# Patient Record
Sex: Male | Born: 1940 | Race: Asian | Hispanic: No | Marital: Married | Smoking: Never smoker
Health system: Southern US, Community
[De-identification: ages and names within clinical notes are randomized; demographics above are authoritative.]

## PROBLEM LIST (undated history)

## (undated) DIAGNOSIS — D649 Anemia, unspecified: Secondary | ICD-10-CM

## (undated) DIAGNOSIS — K219 Gastro-esophageal reflux disease without esophagitis: Secondary | ICD-10-CM

## (undated) DIAGNOSIS — D75839 Thrombocytosis, unspecified: Secondary | ICD-10-CM

## (undated) DIAGNOSIS — D473 Essential (hemorrhagic) thrombocythemia: Secondary | ICD-10-CM

## (undated) DIAGNOSIS — E119 Type 2 diabetes mellitus without complications: Secondary | ICD-10-CM

## (undated) DIAGNOSIS — D72829 Elevated white blood cell count, unspecified: Secondary | ICD-10-CM

## (undated) HISTORY — DX: Thrombocytosis, unspecified: D75.839

## (undated) HISTORY — DX: Type 2 diabetes mellitus without complications: E11.9

## (undated) HISTORY — PX: HERNIA REPAIR: SHX51

## (undated) HISTORY — DX: Gastro-esophageal reflux disease without esophagitis: K21.9

## (undated) HISTORY — DX: Elevated white blood cell count, unspecified: D72.829

## (undated) HISTORY — DX: Essential (hemorrhagic) thrombocythemia: D47.3

## (undated) HISTORY — DX: Anemia, unspecified: D64.9

---

## 2003-07-16 ENCOUNTER — Emergency Department (HOSPITAL_COMMUNITY): Admission: AC | Admit: 2003-07-16 | Discharge: 2003-07-16 | Payer: Self-pay

## 2003-07-17 ENCOUNTER — Emergency Department (HOSPITAL_COMMUNITY): Admission: EM | Admit: 2003-07-17 | Discharge: 2003-07-17 | Payer: Self-pay | Admitting: Emergency Medicine

## 2006-02-04 ENCOUNTER — Ambulatory Visit: Payer: Self-pay | Admitting: Oncology

## 2009-09-09 ENCOUNTER — Emergency Department (HOSPITAL_BASED_OUTPATIENT_CLINIC_OR_DEPARTMENT_OTHER): Admission: EM | Admit: 2009-09-09 | Discharge: 2009-09-09 | Payer: Self-pay | Admitting: Emergency Medicine

## 2009-09-09 ENCOUNTER — Ambulatory Visit: Payer: Self-pay | Admitting: Diagnostic Radiology

## 2011-02-18 LAB — DIFFERENTIAL
Basophils Absolute: 0 10*3/uL (ref 0.0–0.1)
Eosinophils Relative: 0 % (ref 0–5)
Lymphocytes Relative: 2 % — ABNORMAL LOW (ref 12–46)
Lymphs Abs: 0.6 10*3/uL — ABNORMAL LOW (ref 0.7–4.0)
Monocytes Absolute: 0.3 10*3/uL (ref 0.1–1.0)
Monocytes Relative: 1 % — ABNORMAL LOW (ref 3–12)
Neutro Abs: 27.3 10*3/uL — ABNORMAL HIGH (ref 1.7–7.7)

## 2011-02-18 LAB — BASIC METABOLIC PANEL
CO2: 27 mEq/L (ref 19–32)
Calcium: 9.4 mg/dL (ref 8.4–10.5)
Chloride: 103 mEq/L (ref 96–112)
GFR calc non Af Amer: >60 mL/min (ref >60)
Glucose, Bld: 176 mg/dL — ABNORMAL HIGH (ref 70–99)
Sodium: 141 mEq/L (ref 135–145)

## 2011-02-18 LAB — URINE MICROSCOPIC-ADD ON

## 2011-02-18 LAB — CBC
HCT: 44.8 % (ref 39.0–52.0)
MCHC: 33.3 g/dL (ref 30.0–36.0)
MCV: 88.3 fL (ref 78.0–100.0)
Platelets: 664 10*3/uL — ABNORMAL HIGH (ref 150–400)
RBC: 5.07 MIL/uL (ref 4.22–5.81)
RDW: 12.1 % (ref 11.5–15.5)
WBC: 28.2 10*3/uL — ABNORMAL HIGH (ref 4.0–10.5)

## 2011-02-18 LAB — URINE CULTURE: Culture: NO GROWTH

## 2011-02-18 LAB — URINALYSIS, ROUTINE W REFLEX MICROSCOPIC
Ketones, ur: NEGATIVE mg/dL
Nitrite: NEGATIVE
Urobilinogen, UA: 0.2 mg/dL (ref 0.0–1.0)
pH: 5 (ref 5.0–8.0)

## 2011-03-04 ENCOUNTER — Encounter (HOSPITAL_BASED_OUTPATIENT_CLINIC_OR_DEPARTMENT_OTHER): Payer: Medicare Other | Admitting: Oncology

## 2011-03-04 ENCOUNTER — Other Ambulatory Visit: Payer: Self-pay | Admitting: Oncology

## 2011-03-04 DIAGNOSIS — E119 Type 2 diabetes mellitus without complications: Secondary | ICD-10-CM

## 2011-03-04 DIAGNOSIS — D72829 Elevated white blood cell count, unspecified: Secondary | ICD-10-CM

## 2011-03-04 DIAGNOSIS — D72810 Lymphocytopenia: Secondary | ICD-10-CM

## 2011-03-04 DIAGNOSIS — D473 Essential (hemorrhagic) thrombocythemia: Secondary | ICD-10-CM

## 2011-03-04 LAB — CHCC SMEAR

## 2011-03-04 LAB — COMPREHENSIVE METABOLIC PANEL
ALT: 12 U/L (ref 0–53)
Albumin: 5.4 g/dL — ABNORMAL HIGH (ref 3.5–5.2)
BUN: 14 mg/dL (ref 6–23)
Calcium: 10.2 mg/dL (ref 8.4–10.5)
Potassium: 4.7 mEq/L (ref 3.5–5.3)
Sodium: 142 mEq/L (ref 135–145)

## 2011-03-04 LAB — CBC WITH DIFFERENTIAL/PLATELET
BASO%: 0.8 % (ref 0.0–2.0)
Basophils Absolute: 0.2 10*3/uL — ABNORMAL HIGH (ref 0.0–0.1)
MCH: 29 pg (ref 27.2–33.4)
MCV: 88.4 fL (ref 79.3–98.0)
MONO#: 1 10*3/uL — ABNORMAL HIGH (ref 0.1–0.9)
MONO%: 5.3 % (ref 0.0–14.0)
NEUT#: 17 10*3/uL — ABNORMAL HIGH (ref 1.5–6.5)
NEUT%: 87.5 % — ABNORMAL HIGH (ref 39.0–75.0)
nRBC: 0 % (ref 0–0)

## 2011-03-04 LAB — TECHNOLOGIST REVIEW

## 2011-03-09 LAB — BCR/ABL (LIO MMD)

## 2011-03-09 LAB — JAK-2 V617F

## 2011-06-16 ENCOUNTER — Other Ambulatory Visit: Payer: Self-pay | Admitting: Oncology

## 2011-06-16 ENCOUNTER — Encounter (HOSPITAL_BASED_OUTPATIENT_CLINIC_OR_DEPARTMENT_OTHER): Payer: Medicare Other | Admitting: Oncology

## 2011-06-16 DIAGNOSIS — D72810 Lymphocytopenia: Secondary | ICD-10-CM

## 2011-06-16 DIAGNOSIS — D649 Anemia, unspecified: Secondary | ICD-10-CM

## 2011-06-16 DIAGNOSIS — D473 Essential (hemorrhagic) thrombocythemia: Secondary | ICD-10-CM

## 2011-06-16 DIAGNOSIS — D72829 Elevated white blood cell count, unspecified: Secondary | ICD-10-CM

## 2011-06-16 LAB — CBC WITH DIFFERENTIAL/PLATELET
Eosinophils Absolute: 0.2 10*3/uL (ref 0.0–0.5)
HGB: 12.6 g/dL — ABNORMAL LOW (ref 13.0–17.1)
LYMPH%: 6.2 % — ABNORMAL LOW (ref 14.0–49.0)
MCH: 28.8 pg (ref 27.2–33.4)
MCHC: 32.9 g/dL (ref 32.0–36.0)
MCV: 87.6 fL (ref 79.3–98.0)
NEUT#: 15.3 10*3/uL — ABNORMAL HIGH (ref 1.5–6.5)
NEUT%: 89.1 % — ABNORMAL HIGH (ref 39.0–75.0)
RBC: 4.37 10*6/uL (ref 4.20–5.82)
RDW: 13.6 % (ref 11.0–14.6)
WBC: 17.2 10*3/uL — ABNORMAL HIGH (ref 4.0–10.3)
lymph#: 1.1 10*3/uL (ref 0.9–3.3)

## 2011-08-07 ENCOUNTER — Emergency Department (HOSPITAL_COMMUNITY): Payer: Medicare Other

## 2011-08-07 ENCOUNTER — Inpatient Hospital Stay (HOSPITAL_COMMUNITY)
Admission: EM | Admit: 2011-08-07 | Discharge: 2011-08-10 | DRG: 816 | Disposition: A | Discharge status: Home / Self Care | Payer: Medicare Other | Attending: Family Medicine | Admitting: Family Medicine

## 2011-08-07 ENCOUNTER — Inpatient Hospital Stay (HOSPITAL_COMMUNITY): Payer: Medicare Other

## 2011-08-07 DIAGNOSIS — D689 Coagulation defect, unspecified: Secondary | ICD-10-CM

## 2011-08-07 DIAGNOSIS — D473 Essential (hemorrhagic) thrombocythemia: Principal | ICD-10-CM | POA: Diagnosis present

## 2011-08-07 DIAGNOSIS — R52 Pain, unspecified: Secondary | ICD-10-CM | POA: Diagnosis present

## 2011-08-07 DIAGNOSIS — D72829 Elevated white blood cell count, unspecified: Secondary | ICD-10-CM | POA: Diagnosis present

## 2011-08-07 DIAGNOSIS — K644 Residual hemorrhoidal skin tags: Secondary | ICD-10-CM | POA: Diagnosis present

## 2011-08-07 DIAGNOSIS — N289 Disorder of kidney and ureter, unspecified: Secondary | ICD-10-CM | POA: Diagnosis not present

## 2011-08-07 DIAGNOSIS — S20219A Contusion of unspecified front wall of thorax, initial encounter: Secondary | ICD-10-CM | POA: Diagnosis present

## 2011-08-07 DIAGNOSIS — X58XXXA Exposure to other specified factors, initial encounter: Secondary | ICD-10-CM | POA: Diagnosis present

## 2011-08-07 DIAGNOSIS — Z7982 Long term (current) use of aspirin: Secondary | ICD-10-CM

## 2011-08-07 DIAGNOSIS — D699 Hemorrhagic condition, unspecified: Secondary | ICD-10-CM

## 2011-08-07 DIAGNOSIS — D649 Anemia, unspecified: Secondary | ICD-10-CM | POA: Diagnosis present

## 2011-08-07 DIAGNOSIS — K5900 Constipation, unspecified: Secondary | ICD-10-CM | POA: Diagnosis not present

## 2011-08-07 DIAGNOSIS — E119 Type 2 diabetes mellitus without complications: Secondary | ICD-10-CM | POA: Diagnosis present

## 2011-08-07 LAB — CBC
HCT: 21.6 % — ABNORMAL LOW (ref 39.0–52.0)
Hemoglobin: 7.4 g/dL — ABNORMAL LOW (ref 13.0–17.0)
MCH: 28.4 pg (ref 26.0–34.0)
MCH: 28.4 pg (ref 26.0–34.0)
MCV: 82.3 fL (ref 78.0–100.0)
RBC: 2.61 MIL/uL — ABNORMAL LOW (ref 4.22–5.81)

## 2011-08-07 LAB — TSH: TSH: 2.03 u[IU]/mL (ref 0.350–4.500)

## 2011-08-07 LAB — HEPATIC FUNCTION PANEL
AST: 21 U/L (ref 0–37)
Albumin: 4.1 g/dL (ref 3.5–5.2)
Total Protein: 6.8 g/dL (ref 6.0–8.3)

## 2011-08-07 LAB — SEDIMENTATION RATE: Sed Rate: 13 mm/hr (ref 0–16)

## 2011-08-07 LAB — URINALYSIS, ROUTINE W REFLEX MICROSCOPIC
Glucose, UA: 500 mg/dL — AB
Hgb urine dipstick: NEGATIVE
Ketones, ur: 15 mg/dL — AB
Leukocytes, UA: NEGATIVE
Protein, ur: NEGATIVE mg/dL
Specific Gravity, Urine: 1.019 (ref 1.005–1.030)
Urobilinogen, UA: 0.2 mg/dL (ref 0.0–1.0)

## 2011-08-07 LAB — GLUCOSE, CAPILLARY
Glucose-Capillary: 202 mg/dL — ABNORMAL HIGH (ref 70–99)
Glucose-Capillary: 177 mg/dL — ABNORMAL HIGH (ref 70–99)
Glucose-Capillary: 179 mg/dL — ABNORMAL HIGH (ref 70–99)
Glucose-Capillary: 182 mg/dL — ABNORMAL HIGH (ref 70–99)

## 2011-08-07 LAB — OCCULT BLOOD, POC DEVICE: Fecal Occult Bld: NEGATIVE

## 2011-08-07 LAB — BASIC METABOLIC PANEL
BUN: 37 mg/dL — ABNORMAL HIGH (ref 6–23)
Creatinine, Ser: 1.5 mg/dL — ABNORMAL HIGH (ref 0.50–1.35)
GFR calc Af Amer: 56 mL/min — ABNORMAL LOW (ref >60)
GFR calc non Af Amer: 46 mL/min — ABNORMAL LOW (ref >60)
Potassium: 4.6 mEq/L (ref 3.5–5.1)

## 2011-08-07 LAB — FERRITIN: Ferritin: 255 ng/mL (ref 22–322)

## 2011-08-07 LAB — LIPASE, BLOOD: Lipase: 10 U/L — ABNORMAL LOW (ref 11–59)

## 2011-08-07 LAB — DIFFERENTIAL
Eosinophils Absolute: 0 10*3/uL (ref 0.0–0.7)
Lymphs Abs: 1.5 10*3/uL (ref 0.7–4.0)

## 2011-08-08 LAB — BASIC METABOLIC PANEL
BUN: 21 mg/dL (ref 6–23)
GFR calc Af Amer: >60 mL/min (ref >60)
GFR calc non Af Amer: >60 mL/min (ref >60)
Potassium: 4 mEq/L (ref 3.5–5.1)
Sodium: 135 mEq/L (ref 135–145)

## 2011-08-08 LAB — CBC
HCT: 19.2 % — ABNORMAL LOW (ref 39.0–52.0)
Hemoglobin: 6.4 g/dL — CL (ref 13.0–17.0)
Hemoglobin: 9.6 g/dL — ABNORMAL LOW (ref 13.0–17.0)
MCH: 28.1 pg (ref 26.0–34.0)
MCH: 28.2 pg (ref 26.0–34.0)
MCV: 84.2 fL (ref 78.0–100.0)
RBC: 2.28 MIL/uL — ABNORMAL LOW (ref 4.22–5.81)
RBC: 3.4 MIL/uL — ABNORMAL LOW (ref 4.22–5.81)

## 2011-08-08 LAB — PROTIME-INR
INR: 1.26 (ref 0.00–1.49)
Prothrombin Time: 16.1 seconds — ABNORMAL HIGH (ref 11.6–15.2)

## 2011-08-08 LAB — DIFFERENTIAL
Basophils Relative: 0 % (ref 0–1)
Eosinophils Absolute: 0.3 10*3/uL (ref 0.0–0.7)
Eosinophils Relative: 1 % (ref 0–5)
Lymphs Abs: 0.8 10*3/uL (ref 0.7–4.0)
Monocytes Absolute: 2.2 10*3/uL — ABNORMAL HIGH (ref 0.1–1.0)

## 2011-08-08 LAB — URINE CULTURE
Colony Count: NO GROWTH
Culture: NO GROWTH

## 2011-08-08 LAB — GLUCOSE, CAPILLARY
Glucose-Capillary: 160 mg/dL — ABNORMAL HIGH (ref 70–99)
Glucose-Capillary: 180 mg/dL — ABNORMAL HIGH (ref 70–99)

## 2011-08-09 LAB — CROSSMATCH
ABO/RH(D): B POS
Antibody Screen: NEGATIVE
Unit division: 0

## 2011-08-09 LAB — GLUCOSE, CAPILLARY
Glucose-Capillary: 199 mg/dL — ABNORMAL HIGH (ref 70–99)
Glucose-Capillary: 186 mg/dL — ABNORMAL HIGH (ref 70–99)
Glucose-Capillary: 178 mg/dL — ABNORMAL HIGH (ref 70–99)

## 2011-08-09 LAB — PATHOLOGIST SMEAR REVIEW

## 2011-08-09 LAB — CBC
HCT: 26 % — ABNORMAL LOW (ref 39.0–52.0)
Platelets: 816 10*3/uL — ABNORMAL HIGH (ref 150–400)
RDW: 14.4 % (ref 11.5–15.5)
WBC: 30.4 10*3/uL — ABNORMAL HIGH (ref 4.0–10.5)

## 2011-08-09 LAB — BASIC METABOLIC PANEL
Chloride: 100 mEq/L (ref 96–112)
GFR calc Af Amer: >60 mL/min (ref >60)
Potassium: 3.8 mEq/L (ref 3.5–5.1)
Sodium: 136 mEq/L (ref 135–145)

## 2011-08-10 LAB — CBC
HCT: 28.4 % — ABNORMAL LOW (ref 39.0–52.0)
MCV: 84.8 fL (ref 78.0–100.0)
Platelets: 713 10*3/uL — ABNORMAL HIGH (ref 150–400)
RBC: 3.35 MIL/uL — ABNORMAL LOW (ref 4.22–5.81)
WBC: 28.3 10*3/uL — ABNORMAL HIGH (ref 4.0–10.5)

## 2011-08-10 LAB — BASIC METABOLIC PANEL
BUN: 17 mg/dL (ref 6–23)
CO2: 30 mEq/L (ref 19–32)
Chloride: 101 mEq/L (ref 96–112)
Creatinine, Ser: 0.67 mg/dL (ref 0.50–1.35)
Potassium: 4 mEq/L (ref 3.5–5.1)

## 2011-08-10 LAB — GLUCOSE, CAPILLARY
Glucose-Capillary: 173 mg/dL — ABNORMAL HIGH (ref 70–99)
Glucose-Capillary: 181 mg/dL — ABNORMAL HIGH (ref 70–99)

## 2011-08-12 ENCOUNTER — Other Ambulatory Visit: Payer: Self-pay | Admitting: Family Medicine

## 2011-08-12 DIAGNOSIS — R1902 Left upper quadrant abdominal swelling, mass and lump: Secondary | ICD-10-CM

## 2011-08-16 ENCOUNTER — Other Ambulatory Visit: Payer: Self-pay | Admitting: Family Medicine

## 2011-08-16 ENCOUNTER — Other Ambulatory Visit: Payer: Self-pay

## 2011-08-16 ENCOUNTER — Ambulatory Visit
Admission: RE | Admit: 2011-08-16 | Discharge: 2011-08-16 | Disposition: A | Discharge status: Home / Self Care | Payer: Medicare Other | Source: Ambulatory Visit | Attending: Family Medicine | Admitting: Family Medicine

## 2011-08-16 DIAGNOSIS — R1902 Left upper quadrant abdominal swelling, mass and lump: Secondary | ICD-10-CM

## 2011-08-16 MED ORDER — IOHEXOL 300 MG/ML  SOLN: 100.0000 mL | Freq: Once | INTRAMUSCULAR | Status: AC | PRN

## 2011-09-01 NOTE — H&P (Signed)
NAMEMALIC, ROSTEN NO.:  [id number]  MEDICAL RECORD NO.:  [id number]  LOCATION:  4506                         FACILITY:  [facility]  PHYSICIAN:  Gaila Engebretsen A. Sheffield Slider, M.D.    DATE OF BIRTH:  10-14-1941  DATE OF ADMISSION:  08/07/2011 DATE OF DISCHARGE:                             HISTORY & PHYSICAL   PRIMARY CARE PHYSICIAN:  Titus Dubin. Alwyn Ren, MD,FACP,FCCP at [place]  HEMATOLOGIST:  Benjiman Core, MD  CHIEF COMPLAINT:  Generalized pain.  HISTORY OF PRESENT ILLNESS:  Mr. Isidore is a 70 year old [place] man who does not speak any English, therefore the HPI is provided by his son. The patient had acute onset generalized pain starting 4 days ago.  It is primarily in his right shoulder and legs bilaterally.  He denies any recent falls or trauma.  It is preventing him from using his right upper extremity completely.  This is a large deviation from baseline, which is very active.  During his workup at [facility] ED, the patient was found to have a white blood cell count 49.6 and a platelet count of 1,400,000. According to his son, Mr. Corsino has been told before that he has elevated blood cell counts and has seen a physician at [facility] regarding the problem.  However, the patient states that he was not supposed to followup for another year.  MEDICAL HISTORY: 1. Unspecified leukocytosis and thrombocytosis. 2. Diabetes mellitus type 2. 3. Fractured clavicle, left.  ALLERGIES:  No known drug allergies.  MEDICATIONS: 1. Metformin 500 mg b.i.d. 2. Aspirin 81 mg daily.  PAST SURGICAL HISTORY:  Hernia repair, left side.  SOCIAL HISTORY:  The patient lives with wife.  He is retired.  Denies tobacco.  He occasionally uses alcohol.  He does not use any recreational drugs.  REVIEW OF SYSTEMS:  Positive for increasing appetite, increasing fatigue, weight loss of 50 pounds in 2 years, constipation x4 days, urinary hesitancy, bruising on his anterior lower  extremities bilaterally, generalized weakness, and polyuria.  PHYSICAL EXAMINATION:  VITAL SIGNS:  Temperature at the time of admission 97.5, pulse 86, respiratory rate 20, blood pressure 98/46, oxygen saturation 100% on room air. GENERAL:  The patient is alert and oriented x3, is quiet, little comprehensive in [place]. HEENT: Normocephalic, atraumatic.  Pupils equal, round, and reactive to light.  Poor dentition.  Oropharynx clear. NECK:  Supple, nontender, no adenopathy. CARDIOVASCULAR:  2/6 systolic murmur, sinus arrhythmia. LUNGS:  Decreased breath sounds on right versus left, dullness to percussion right versus left. ABDOMEN:  Soft, nondistended, tender to palpation in the epigastric area.  No masses noted.  Hyperactive bowel sounds x4 quadrants. BACK:  Asymmetry of upper back right side with prominent soft tissue swelling over scapula and this was dorsi, ecchymosis noted on the right flank. GENITOURINARY:  Right-sided indirect hernias nontender, nonreducible. RECTAL:  One small non-thrombosed external hemorrhoid.  Good rectal tone.  Stool in rectal vault. EXTREMITIES:  Significant bruising and hemosiderin deposition on anterior tibia bilaterally, 2+ posterior tibial pulses and radial pulses bilaterally, nontender extremities, no joint effusions. NEUROLOGIC:  1/4 patellar and plantar reflexes most likely secondary to poor patient compliance with exam. MUSCULOSKELETAL:  Normal muscle  tone, grip strength equal bilaterally 5/5 left upper extremity.  Lower extremity strength bilaterally.  LABS AND STUDIES:  BMET; sodium 130, potassium 4.6, chloride 92, CO2 of 26, BUN 37, creatinine 1.5, glucose 289, calcium 9.5.  Urinalysis positive for glucose and ketones.  CBC; white blood cell count 49.6, hemoglobin 8.8, hematocrit 25.5, platelets 1449, MCV 87.3, RDW equals 13.9, percent neutrophils equals 89.  Chest and abdominal x-ray no acute cardiopulmonary events.  Round calcification of  right upper quadrant. Unremarkable gas pattern.  ASSESSMENT/PLAN:  Mr. Ron is a 70 year old male with subacute onset pain predominantly right shoulder as well as significant thrombocytosis and leukocytosis of undetermined origin, requires further workup. 1. Admit the patient to [facility] Service under     attending physician, Dr. Sheffield Slider to a regular floor bed. 2. Hematology/Oncology.  The thrombocytosis and leukocytosis could be     reactive, but there is no known source of infection.  It could     represent a malignancy or blood cell dyscrasia.     a.     Check ESR, CRP, ferritin, and CT abdomen to evaluate spleen.     b.     Check on records from hematologist, Dr. Clelia Croft. 3. Musculoskeletal pain in the right shoulder.  Posterior back seem to     have suffered trauma based on swelling and bruising; however, there     could be an element of scoliosis causing asymmetry, but would not     account for the edema and exquisite tenderness.     a.     Medications; morphine 2 mg IV q.4 h. p.r.n. for pain,      Tylenol 650 mg q.4 h. p.r.n. for pain.     b.     Studies PA and lateral chest scapula x-ray on the right. 4. Constipation.  MiraLax 17 g daily, glycerin suppository 2 g per     rectum daily. 5. Fluid, electrolytes, and nutrition/gastrointestinal.  IV fluids and     normal saline 100 mL/hour, carb-modified diet as tolerated, 6. Diabetes mellitus.  Hold metformin for now secondary to increased     creatinine, give sliding scale insulin before     every meals and nightly. 7. Prophylaxis.  Heparin 5000 units t.i.d. subcu, Protonix 40 mg p.o.     daily. 8. Disposition.  Pending further workup.    ______________________________ Mat Carne, MD   ______________________________ Arnette Norris. Sheffield Slider, M.D.    EW/MEDQ  D:  08/10/2011  T:  08/10/2011  Job:  [id number]  Electronically Signed by Mat Carne  on 08/23/2011 04:02:03 PM Electronically Signed by Zachery Dauer  M.D. on 09/01/2011 10:06:19 PM

## 2011-09-01 NOTE — Discharge Summary (Signed)
[name], [name] NO.:  [id number]  MEDICAL RECORD NO.:  [id number]  LOCATION:  4506                         FACILITY:  [facility]  PHYSICIAN:  [name], M.D.    DATE OF BIRTH:  [birth year]  DATE OF ADMISSION:  08/07/2011 DATE OF DISCHARGE:  08/10/2011                              DISCHARGE SUMMARY   PRIMARY CARE PHYSICIAN:  [name], [name],[name], [name] at [place].  HEMATOLOGIST:  [name]. Clelia Croft, [name]  ADMISSION DIAGNOSIS:  Marked thrombocytosis and leukocytosis with concern for malignancy.  DISCHARGE DIAGNOSES: 1. Hematoma, right lateral posterior chest wall. 2. Anemia which is resolving. 3. Reactive leukocytosis and thrombocytosis, resolving. 4. Diabetes mellitus type 2.  HOSPITAL COURSE:  [name] presented to the emergency department with a 4-day history of significant generalized pain, most noteworthy in the right upper extremity, right posterior shoulder, as well as constipation.  In the emergency department, he was found to have a CBC noteworthy for white blood cell count of 49.6, hemoglobin of 8.8, and platelets of 1449.  The physician was concerned that this could represent a malignant process.  So, the patient was admitted to the [facility] Medicine Teaching Service for further workup.  PROBLEMS: 1. Hematology.  Upon admission, the patient had a CT of the chest wall     performed that showed a 13.1 cm x 4.7 cm x 19.1 cm hematoma of the     right posterior lateral chest wall of undetermined origin.  The     patient was in significant amount of pain with decreased range of     motion of the right upper extremity secondary to the hematoma,     therefore morphine was used for pain control.  His CBC on day 2 of     admission was white blood cell count 36.4, hemoglobin 7.4,     platelets 1143.  On the next day, the patient's hemoglobin had     declined to 6.4, therefore the patient was given 2 units of packed     red blood cells.  After the use  of packed red blood cells,     hemoglobin increased to 9.6, and at that time the white blood cell     count was 34 and platelets were 935.  Given the concern for     malignant process, pathologist reviewed blood smear and noted that     marked thrombocytosis associated with neutrophilia and mild left     shift appeared to be reactive and probably related.  The patient's     serum markers for inflammation include CRP that was elevated at     7.61, however, the patient's ESR was normal.  The patient also had     an elevated PT at 16.1, INR 1.26, and PTT of 42 for undetermined     causes.  By day 3 of admission, the patient's hematoma had began to     decrease in size and create a large ecchymosis along the right     flank.  The patient also had decreasing pain and increasing range     of motion.  According to the patient's son, he had been worked  up     in the past by [name] for this thrombocytosis and leukocytosis.     Therefore, we attempted to contact [name] office several     times on Monday, August 09, 2011, and Tuesday August 10, 2011.  However, we received no reply, [name] regarding the     patient's history.  When we requested to schedule followup for the     patient, we were told that [name] likes to review the patient's     records first and then set up the followup.  By the fourth day of     admission, the patient was back to baseline regarding range of     motion and pain in the right upper extremity, however, his hematoma     was still noteworthy lateral to his right scapula that was     resolving and his hemoglobin had stabilized.  On the day of     discharge, a CBC was noteworthy for a white blood cell count of     28.3, hemoglobin of 9.3, and platelets of 713. 2. Diabetes mellitus type 2.  The patient had a hemoglobin A1c of 7.6,     was treated with sliding scale insulin while in the hospital.  DISCHARGE CONDITION:  The patient has a right-sided  flank ecchymosis extending from the axilla to the upper aspect of the right buttocks and hematoma of the posterior lateral chest wall on the right side that is resolving.  His hemoglobin is stable and his BMET was within normal limits except no elevated glucose.  DISCHARGE MEDICATIONS: 1. Polyethylene glycol 17 g by mouth daily. 2. Aspirin 81 mg by mouth daily. 3. Centrum Silver 1 tablet by mouth daily. 4. Metformin 500 mg by mouth twice daily.  FOLLOWUP: 1. Hematology.  The patient will followup with [name] whenever     appropriate. 2. Patient will follow up with Dr. Marga Melnick, his primary care     physician within 7 days.  FOLLOWUP ISSUES: 1. His recheck CBC to make sure that hemoglobin is stable or     increasing from his discharge hemoglobin of 9.3. 2. Blood glucose control. 3. Pain management for hematoma.    ______________________________ [name]   ______________________________ [name], M.D.    EW/MEDQ  D:  08/10/2011  T:  08/10/2011  Job:  [id number]  cc:   [name], [name],[name],[name] Benjiman Core, M.D.  Electronically Signed by [name]  on 08/23/2011 04:01:45 PM Electronically Signed by [name] M.D. on 09/01/2011 10:06:11 PM

## 2011-09-15 ENCOUNTER — Ambulatory Visit (INDEPENDENT_AMBULATORY_CARE_PROVIDER_SITE_OTHER): Payer: Medicare Other | Admitting: Gastroenterology

## 2011-09-15 ENCOUNTER — Telehealth: Payer: Self-pay | Admitting: Gastroenterology

## 2011-09-15 ENCOUNTER — Encounter: Payer: Self-pay | Admitting: Gastroenterology

## 2011-09-15 VITALS — BP 118/60 | HR 60 | Ht 65.0 in | Wt 119.0 lb

## 2011-09-15 DIAGNOSIS — K5900 Constipation, unspecified: Secondary | ICD-10-CM

## 2011-09-15 DIAGNOSIS — D649 Anemia, unspecified: Secondary | ICD-10-CM

## 2011-09-15 DIAGNOSIS — R634 Abnormal weight loss: Secondary | ICD-10-CM

## 2011-09-15 MED ORDER — PEG-KCL-NACL-NASULF-NA ASC-C 100 G PO SOLR: 1.0000 | Freq: Once | ORAL | Status: DC

## 2011-09-15 NOTE — Telephone Encounter (Signed)
Spoke with Tammy and informed her that we do have a free sample of this prep for the patient if they can come by and get it for him. Tammy states she will come get it tomorrow. Sample kit left up front.

## 2011-09-15 NOTE — Patient Instructions (Signed)
You have been scheduled for a Colonoscopy with propofol. See separate instructions.  Pick up your prep kit from your pharmacy.  Follow instructions on Hemoccult cards and mail back to [place] when finished.  cc: Janace Hoard, MD       Eli Hose, MD

## 2011-09-15 NOTE — Progress Notes (Signed)
History of Present Illness: This is a 70 year old male originally from [place]. He has lived in [place] for over 30 years. He is here today with his son and a Nurse, learning disability. His son provides most of the translation however the translator was needed on several occasions. The patient does not speak any [place]. He apparently has had a slow and steady weight loss of approximately 80 pounds over the past 2-3 years. His diet has been modified substantially for management of diabetes. He was hospitalized in September and I reviewed the records from that hospitalization. He had a right body hematoma with leukocytosis, anemia and thrombocytosis. He also had a mild elevation of his INR. His hemoglobin was 7.4 on admission and improved to 9.3 at discharge. The cause of these abnormalities has not been determined. He has had followup with Dr. Juliette Alcide. He relates a 3 to four-month history of constipation. Denies abdominal pain, diarrhea, change in stool caliber, melena, hematochezia, nausea, vomiting, dysphagia, reflux symptoms, chest pain.  Past Medical History  Diagnosis Date  . Leukocytosis   . Thrombocytosis   . Diabetes mellitus, type 2   . Anemia     Hx of   . GERD (gastroesophageal reflux disease)    Past Surgical History  Procedure Date  . Hernia repair     reports that he has never smoked. He has never used smokeless tobacco. He reports that he drinks alcohol. He reports that he does not use illicit drugs. family history is negative for Colon cancer. No Known Allergies  Outpatient Encounter Prescriptions as of 09/15/2011  Medication Sig Dispense Refill  . aspirin 81 MG tablet Take 81 mg by mouth daily.        . metFORMIN (GLUCOPHAGE) 500 MG tablet Take 500 mg by mouth 2 (two) times daily with a meal.        . Multiple Vitamin (MULTIVITAMIN) tablet Take 1 tablet by mouth daily.        . vitamin k 100 MCG tablet Take 100 mcg by mouth daily.        . peg 3350 powder (MOVIPREP) 100 G SOLR Take 1  kit (100 g total) by mouth once.  1 kit  0   Review of Systems: Pertinent positive and negative review of systems were noted in the above HPI section. All other review of systems were otherwise negative.  Physical Exam: General: Well developed , well nourished, no acute distress Head: Normocephalic and atraumatic Eyes:  sclerae anicteric, EOMI Ears: Normal auditory acuity Mouth: No deformity or lesions Neck: Supple, no masses or thyromegaly Lungs: Clear throughout to auscultation Heart: Regular rate and rhythm; no murmurs, rubs or bruits Abdomen: Soft, non tender and non distended. No masses, hepatosplenomegaly or hernias noted. Normal Bowel sounds Rectal: Deferred to colonoscopy  Musculoskeletal: Symmetrical with no gross deformities  Skin: No lesions on visible extremities Pulses:  Normal pulses noted Extremities: No clubbing, cyanosis, edema or deformities noted Neurological: Alert oriented x 4, grossly nonfocal Cervical Nodes:  No significant cervical adenopathy Inguinal Nodes: No significant inguinal adenopathy Psychological:  Alert and cooperative. Normal mood and affect  Assessment and Recommendations:  1. Constipation and long-term weight loss. Recent anemia associated with leukocytosis and thrombocytosis. Etiology unclear. Obtain stool Hemoccults. Request records from his hematologist for review. Rule out colorectal neoplasms. Increase dietary fiber and water intake. Schedule colonoscopy. The risks, benefits, and alternatives to colonoscopy with possible biopsy and possible polypectomy were discussed with the patient and they consent to proceed.

## 2011-10-12 ENCOUNTER — Telehealth: Payer: Self-pay | Admitting: *Deleted

## 2011-10-12 NOTE — Telephone Encounter (Signed)
CALLED PATIENTS HOME WITH MESSAGE LEFT FOR THE PATIENT/FAMILY TO CALL us. INTENT OF CALL TO INQUIRE AS TO NEED FOR AN INTERPRETER. NO ANSWER. SON'S TELEPHONE NUMBER PROVIDED HAS BEEN DISCONNECTED.

## 2011-10-12 NOTE — Telephone Encounter (Signed)
SON, DANNY CALLED AND STATED HE WOULD BE THE INTERPRETER TOMORROW FOR HIS FATHER. NO NEED FOR A CONE INTERPRETER.

## 2011-10-13 ENCOUNTER — Encounter: Payer: Self-pay | Admitting: Gastroenterology

## 2011-10-13 ENCOUNTER — Ambulatory Visit (AMBULATORY_SURGERY_CENTER): Payer: Medicare Other | Admitting: Gastroenterology

## 2011-10-13 DIAGNOSIS — R634 Abnormal weight loss: Secondary | ICD-10-CM

## 2011-10-13 DIAGNOSIS — D649 Anemia, unspecified: Secondary | ICD-10-CM

## 2011-10-13 DIAGNOSIS — D126 Benign neoplasm of colon, unspecified: Secondary | ICD-10-CM

## 2011-10-13 DIAGNOSIS — K5900 Constipation, unspecified: Secondary | ICD-10-CM

## 2011-10-13 LAB — GLUCOSE, CAPILLARY: Glucose-Capillary: 100 mg/dL — ABNORMAL HIGH (ref 70–99)

## 2011-10-13 MED ORDER — SODIUM CHLORIDE 0.9 % IV SOLN: 500.0000 mL | INTRAVENOUS | Status: DC

## 2011-10-13 NOTE — Progress Notes (Signed)
Propofol administered by m smith crna. See scanned intra procedure report. ewm

## 2011-10-13 NOTE — Progress Notes (Signed)
Patient did not experience any of the following events: a burn prior to discharge; a fall within the facility; wrong site/side/patient/procedure/implant event; or a hospital transfer or hospital admission upon discharge from the facility. (G8907) Patient did not have preoperative order for IV antibiotic SSI prophylaxis. (G8918)  

## 2011-10-14 ENCOUNTER — Telehealth: Payer: Self-pay

## 2011-10-14 NOTE — Telephone Encounter (Signed)
I left a message on the pt's a.m. To call if any questions or concerns. maw

## 2011-10-18 ENCOUNTER — Encounter: Payer: Self-pay | Admitting: Gastroenterology

## 2011-10-20 ENCOUNTER — Encounter: Payer: Self-pay | Admitting: Gastroenterology

## 2011-11-11 ENCOUNTER — Ambulatory Visit (INDEPENDENT_AMBULATORY_CARE_PROVIDER_SITE_OTHER): Payer: Medicare Other

## 2011-11-11 DIAGNOSIS — R161 Splenomegaly, not elsewhere classified: Secondary | ICD-10-CM

## 2011-11-11 DIAGNOSIS — D7289 Other specified disorders of white blood cells: Secondary | ICD-10-CM

## 2011-11-11 DIAGNOSIS — E119 Type 2 diabetes mellitus without complications: Secondary | ICD-10-CM

## 2011-11-11 DIAGNOSIS — R634 Abnormal weight loss: Secondary | ICD-10-CM

## 2011-11-27 ENCOUNTER — Telehealth: Payer: Self-pay | Admitting: Oncology

## 2011-11-27 NOTE — Telephone Encounter (Signed)
S/w pt's son re appt for 2/20. lmonvm for lawanna requesting maderin translator for appt 2/20 @ 11:30 am.

## 2011-11-30 ENCOUNTER — Ambulatory Visit (INDEPENDENT_AMBULATORY_CARE_PROVIDER_SITE_OTHER): Payer: Medicare Other

## 2011-11-30 DIAGNOSIS — I739 Peripheral vascular disease, unspecified: Secondary | ICD-10-CM

## 2011-11-30 DIAGNOSIS — M79609 Pain in unspecified limb: Secondary | ICD-10-CM

## 2011-12-09 ENCOUNTER — Ambulatory Visit (HOSPITAL_COMMUNITY): Payer: Self-pay

## 2011-12-14 ENCOUNTER — Ambulatory Visit (HOSPITAL_COMMUNITY)
Admission: RE | Admit: 2011-12-14 | Discharge: 2011-12-14 | Disposition: A | Discharge status: Home / Self Care | Payer: Medicare Other | Source: Ambulatory Visit | Attending: Family Medicine | Admitting: Family Medicine

## 2011-12-14 DIAGNOSIS — M79609 Pain in unspecified limb: Secondary | ICD-10-CM | POA: Insufficient documentation

## 2011-12-14 DIAGNOSIS — M79673 Pain in unspecified foot: Secondary | ICD-10-CM

## 2011-12-14 NOTE — Progress Notes (Signed)
ABI completed.  Preliminary report is >1.0 bilaterally. 

## 2011-12-25 ENCOUNTER — Telehealth: Payer: Self-pay

## 2011-12-25 NOTE — Telephone Encounter (Signed)
Calling for xrays results

## 2011-12-26 NOTE — Telephone Encounter (Signed)
LMOM FOR PT TO CB.  THE ONLY RESULT THAT WE HAVE IS THE CIRCULATION STUDY.  The circulation study (ankle-brachial index (was normal. I will have my assistant call the patient and let him know. If I recall there is some language barrier and they may have a little difficulty understanding any further problems return for recheck. I do not plan to do anything differently at this time.

## 2011-12-26 NOTE — Telephone Encounter (Signed)
SPOKE WITH SON AND GAVE RESULTS

## 2012-01-05 ENCOUNTER — Telehealth: Payer: Self-pay | Admitting: Oncology

## 2012-01-05 ENCOUNTER — Other Ambulatory Visit: Payer: Medicare Other | Admitting: Lab

## 2012-01-05 ENCOUNTER — Ambulatory Visit (HOSPITAL_BASED_OUTPATIENT_CLINIC_OR_DEPARTMENT_OTHER): Payer: Medicare Other | Admitting: Oncology

## 2012-01-05 VITALS — BP 127/58 | HR 65 | Temp 97.8°F | Ht 65.0 in | Wt 127.2 lb

## 2012-01-05 DIAGNOSIS — D649 Anemia, unspecified: Secondary | ICD-10-CM

## 2012-01-05 DIAGNOSIS — D473 Essential (hemorrhagic) thrombocythemia: Secondary | ICD-10-CM

## 2012-01-05 DIAGNOSIS — D72829 Elevated white blood cell count, unspecified: Secondary | ICD-10-CM

## 2012-01-05 LAB — CBC WITH DIFFERENTIAL/PLATELET
BASO%: 0 % (ref 0.0–2.0)
EOS%: 1.2 % (ref 0.0–7.0)
Eosinophils Absolute: 0.2 10*3/uL (ref 0.0–0.5)
MCH: 29.2 pg (ref 27.2–33.4)
MCHC: 32.9 g/dL (ref 32.0–36.0)
MCV: 88.7 fL (ref 79.3–98.0)
MONO%: 2.2 % (ref 0.0–14.0)
NEUT#: 15.9 10*3/uL — ABNORMAL HIGH (ref 1.5–6.5)
RBC: 4.44 10*6/uL (ref 4.20–5.82)
RDW: 14.7 % — ABNORMAL HIGH (ref 11.0–14.6)

## 2012-01-05 NOTE — Telephone Encounter (Signed)
gv pt appt schedule for aug and sent message to lawana for [employer].

## 2012-01-05 NOTE — Progress Notes (Signed)
Hematology and Oncology Follow Up Visit  [name] [id number] March 05, 1941 71 y.o. 01/05/2012 12:18 PM    CC: Nicholas Najjar, MD    Principle Diagnosis: A 71 year old gentleman with leukocytosis and thrombocytosis, first evaluated in April 2012, etiology including a myeloproliferative disorder versus reactive process.   Interim History:  [name] presents today for a followup visit.  This is a gentleman whom I saw for the first time back in April 2012.  He had presented with a white cell count of 19,000, hemoglobin 14, platelet count was 841.  His workup so far has been unrevealing for any specific myeloproliferative disorder.  His JAK2 mutations have been positive, indicating evidence against a myeloproliferative disorder. His BCR-Abl was negative.  He is asymptomatic at this point.  He is not reporting any chest pain, does not report any difficulty breathing, is not reporting any major changes in his performance status, is not reporting any change in his appetite or development of constitutional symptoms.  He had not reported any headaches or blurry vision, did not report any recent hospitalization or illnesses.  Medications: I have reviewed the patient's current medications. Current outpatient prescriptions:aspirin 81 MG tablet, Take 81 mg by mouth daily.  , Disp: , Rfl: ;  metFORMIN (GLUCOPHAGE) 500 MG tablet, Take 500 mg by mouth 2 (two) times daily with a meal.  , Disp: , Rfl: ;  Multiple Vitamin (MULTIVITAMIN) tablet, Take 1 tablet by mouth daily.  , Disp: , Rfl: ;  vitamin k 100 MCG tablet, Take 100 mcg by mouth daily.  , Disp: , Rfl:   Allergies: No Known Allergies  Past Medical History, Surgical history, Social history, and Family History were reviewed and updated.  Review of Systems: Constitutional:  Negative for fever, chills, night sweats, anorexia, weight loss, pain. Cardiovascular: no chest pain or dyspnea on exertion Respiratory: no cough, shortness of breath, or  wheezing Neurological: no TIA or stroke symptoms Dermatological: negative ENT: negative Skin: Negative. Gastrointestinal: no abdominal pain, change in bowel habits, or black or bloody stools Genito-Urinary: no dysuria, trouble voiding, or hematuria Hematological and Lymphatic: negative Breast: negative Musculoskeletal: negative Remaining ROS negative. Physical Exam: Blood pressure 127/58, pulse 65, temperature 97.8 F (36.6 C), temperature source Oral, height 5\' 5"  (1.651 m), weight 127 lb 3.2 oz (57.698 kg). ECOG: 1 General appearance: alert Head: Normocephalic, without obvious abnormality, atraumatic Neck: no adenopathy, no carotid bruit, no JVD, supple, symmetrical, trachea midline and thyroid not enlarged, symmetric, no tenderness/mass/nodules Lymph nodes: Cervical, supraclavicular, and axillary nodes normal. Heart:regular rate and rhythm, S1, S2 normal, no murmur, click, rub or gallop Lung:chest clear, no wheezing, rales, normal symmetric air entry Abdomin: soft, non-tender, without masses or organomegaly EXT:no erythema, induration, or nodules   Lab Results: Lab Results  Component Value Date   WBC 17.8* 01/05/2012   HGB 13.0 01/05/2012   HCT 39.4 01/05/2012   MCV 88.7 01/05/2012   PLT 801* 01/05/2012     Chemistry      Component Value Date/Time   NA 136 08/10/2011 0600   K 4.0 08/10/2011 0600   CL 101 08/10/2011 0600   CO2 30 08/10/2011 0600   BUN 17 08/10/2011 0600   CREATININE 0.67 08/10/2011 0600      Component Value Date/Time   CALCIUM 8.9 08/10/2011 0600   ALKPHOS 62 08/07/2011 0416   AST 21 08/07/2011 0416   ALT 10 08/07/2011 0416   BILITOT 0.5 08/07/2011 0416       Impression and Plan:  This is a pleasant 71 year old gentleman with the following issues:  1. Leukocytosis and thrombocytosis, differential diagnosis including a myeloproliferative disorder.  His BCR-ABL have been negative for chronic myelogenous leukemia.  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  At this point, given his platelet counts are dropping, his risk of thrombosis is still a little bit low, given that he has no history of any thrombotic events.  He is already on aspirin.  Will continue to watch it and again if his platelet cont go higher than 800 , then I will consider using hydroxyurea.  2. Anemia.  This is relatively mild.  His hemoglobin is 13.0.  Followup will be in 6 months' time.    Eli Hose, MD 2/20/201312:18 PM

## 2012-01-24 ENCOUNTER — Other Ambulatory Visit: Payer: Self-pay | Admitting: Physician Assistant

## 2012-01-24 NOTE — Progress Notes (Signed)
Pt's son came into 94 to ask for Metformin RF for pt who is totally out of med. Called in RF x 3 to [facility] and notified son that pt is due for f/up OV in April.

## 2012-03-15 ENCOUNTER — Ambulatory Visit (INDEPENDENT_AMBULATORY_CARE_PROVIDER_SITE_OTHER): Payer: Medicare Other | Admitting: Family Medicine

## 2012-03-15 VITALS — BP 127/57 | HR 63 | Temp 98.0°F | Resp 16 | Ht 65.0 in | Wt 126.8 lb

## 2012-03-15 DIAGNOSIS — G629 Polyneuropathy, unspecified: Secondary | ICD-10-CM

## 2012-03-15 DIAGNOSIS — H3570 Unspecified separation of retinal layers: Secondary | ICD-10-CM

## 2012-03-15 DIAGNOSIS — G609 Hereditary and idiopathic neuropathy, unspecified: Secondary | ICD-10-CM

## 2012-03-15 DIAGNOSIS — E119 Type 2 diabetes mellitus without complications: Secondary | ICD-10-CM

## 2012-03-15 DIAGNOSIS — Z789 Other specified health status: Secondary | ICD-10-CM

## 2012-03-15 MED ORDER — METFORMIN HCL 500 MG PO TABS: 500.0000 mg | ORAL_TABLET | Freq: Two times a day (BID) | ORAL | Status: DC

## 2012-03-15 MED ORDER — GABAPENTIN 100 MG PO CAPS: 200.0000 mg | ORAL_CAPSULE | Freq: Three times a day (TID) | ORAL | Status: DC

## 2012-03-15 NOTE — Patient Instructions (Signed)
Continue same dose of metformin for diabetes.  It is well controlled on today's labs. Continue gabapentin for foot pain, and follow up with Dr. Alwyn Ren to discuss this. Return to the clinic or go to the nearest emergency room if any of your symptoms worsen or new symptoms occur.

## 2012-03-15 NOTE — Progress Notes (Signed)
  Subjective:    Patient ID: [name], male    DOB: [birth year], 71 y.o.   MRN: [id number]  HPI [name] is a 71 y.o. male Here for checkup on medications for diabetes. No side effects with meds. Not checking outside blood sugars.  Does not have meter. Feels fine. No chest  Feels leg and feet feel numb at times - taking gabapentin 3x/day past 3 months.  A little better with this med, but still some pains in feet.   Language barrier - son on phone translating - initial call 10 minutes, end call less than 5 minutes.   Review of Systems  Respiratory: Negative for chest tightness and shortness of breath.   Cardiovascular: Negative for chest pain.  Gastrointestinal: Negative for abdominal pain.  Neurological: Negative for dizziness and light-headedness.       Objective:   Physical Exam  Constitutional: He is oriented to person, place, and time. He appears well-developed and well-nourished.  HENT:  Head: Normocephalic and atraumatic.  Eyes: Pupils are equal, round, and reactive to light.  Cardiovascular: Normal rate, regular rhythm, normal heart sounds and intact distal pulses.   Pulmonary/Chest: Effort normal and breath sounds normal.  Abdominal: Soft. There is no tenderness.  Neurological: He is alert and oriented to person, place, and time.       Microfilament testing of feet normal bilaterally.  Skin: Skin is warm, dry and intact. No rash noted.  Psychiatric: He has a normal mood and affect. His behavior is normal.    Results for orders placed in visit on 03/15/12  GLUCOSE, POCT (MANUAL RESULT ENTRY)      Component Value Range   POC Glucose 149    POCT GLYCOSYLATED HEMOGLOBIN (HGB A1C)      Component Value Range   Hemoglobin A1C 6.5           Assessment & Plan:  [name] is a 71 y.o. male 1. Peripheral neuropathy    2. DM2 (diabetes mellitus, type 2)  POCT glucose (manual entry), POCT glycosylated hemoglobin (Hb A1C)  3. Language Barrier     Diabetes  well controlled.  Continue metformin 500mg  BID, 3 month recheck with Dr. Alwyn Ren.  Peripheral Neuropathy - some improvement.  Continue neurontin at current dose and follow up with Dr. Alwyn Ren.  Language barrier - son interpreted by phone.  Understanding expressed.

## 2012-06-29 ENCOUNTER — Telehealth: Payer: Self-pay | Admitting: Oncology

## 2012-06-29 ENCOUNTER — Telehealth: Payer: Self-pay

## 2012-06-29 NOTE — Telephone Encounter (Signed)
Please pull chart.

## 2012-06-29 NOTE — Telephone Encounter (Signed)
Changed pharmacy in the system, left message for patient to find out what medications he is in need of.

## 2012-06-29 NOTE — Telephone Encounter (Signed)
Moved 8/21 appt to 9/25. lmonvm for pt and mailed schedule. Tried to call son Dannielle Huh but number is d/c.

## 2012-06-29 NOTE — Telephone Encounter (Signed)
The patient's son called to ask that his pharmacy be changed to [facility] pharmacy at [address] [place] [address].  The patient's son was advised to take medication bottles to the new pharmacy and have them contact [facility] but that [facility] will change the pharmacy in the system.

## 2012-06-29 NOTE — Telephone Encounter (Signed)
The patient's son called again to state that the pharmacy needed [facility] to call and refill medications.  Please call the pharmacy at [phone].

## 2012-06-29 NOTE — Telephone Encounter (Signed)
Simvastatin 40mg  and losartan 50mg  need renewals please advise

## 2012-06-29 NOTE — Telephone Encounter (Signed)
patients son will call me back about these meds, he is unsure what needs to be renewed.

## 2012-06-29 NOTE — Telephone Encounter (Signed)
Patient's chart is at the nurses station in the pa pool pile. [id number]

## 2012-06-30 NOTE — Telephone Encounter (Signed)
We do not have record of him being on either of these medications. Who writes for them?

## 2012-06-30 NOTE — Telephone Encounter (Signed)
This is for patients wife, have moved to correct chart thank you

## 2012-07-05 ENCOUNTER — Ambulatory Visit: Payer: Medicare Other | Admitting: Oncology

## 2012-07-05 ENCOUNTER — Other Ambulatory Visit: Payer: Medicare Other | Admitting: Lab

## 2012-08-09 ENCOUNTER — Ambulatory Visit (HOSPITAL_BASED_OUTPATIENT_CLINIC_OR_DEPARTMENT_OTHER): Payer: Medicare Other | Admitting: Oncology

## 2012-08-09 ENCOUNTER — Other Ambulatory Visit (HOSPITAL_BASED_OUTPATIENT_CLINIC_OR_DEPARTMENT_OTHER): Payer: Medicare Other | Admitting: Lab

## 2012-08-09 ENCOUNTER — Telehealth: Payer: Self-pay | Admitting: Oncology

## 2012-08-09 VITALS — BP 121/54 | HR 60 | Temp 98.9°F | Resp 20 | Ht 65.0 in | Wt 129.8 lb

## 2012-08-09 DIAGNOSIS — D473 Essential (hemorrhagic) thrombocythemia: Secondary | ICD-10-CM

## 2012-08-09 DIAGNOSIS — D72829 Elevated white blood cell count, unspecified: Secondary | ICD-10-CM

## 2012-08-09 DIAGNOSIS — D649 Anemia, unspecified: Secondary | ICD-10-CM

## 2012-08-09 LAB — CBC WITH DIFFERENTIAL/PLATELET
Eosinophils Absolute: 0.2 10*3/uL (ref 0.0–0.5)
HCT: 37.8 % — ABNORMAL LOW (ref 38.4–49.9)
LYMPH%: 7 % — ABNORMAL LOW (ref 14.0–49.0)
MCV: 88.8 fL (ref 79.3–98.0)
MONO#: 0.7 10*3/uL (ref 0.1–0.9)
MONO%: 5.1 % (ref 0.0–14.0)
NEUT#: 11.8 10*3/uL — ABNORMAL HIGH (ref 1.5–6.5)
NEUT%: 84.8 % — ABNORMAL HIGH (ref 39.0–75.0)
Platelets: 663 10*3/uL — ABNORMAL HIGH (ref 140–400)
RBC: 4.25 10*6/uL (ref 4.20–5.82)
WBC: 13.9 10*3/uL — ABNORMAL HIGH (ref 4.0–10.3)

## 2012-08-09 LAB — COMPREHENSIVE METABOLIC PANEL (CC13)
AST: 14 U/L (ref 5–34)
Alkaline Phosphatase: 57 U/L (ref 40–150)
Glucose: 145 mg/dl — ABNORMAL HIGH (ref 70–99)
Sodium: 141 mEq/L (ref 136–145)
Total Bilirubin: 0.6 mg/dL (ref 0.20–1.20)
Total Protein: 7.2 g/dL (ref 6.4–8.3)

## 2012-08-09 NOTE — Progress Notes (Signed)
Hematology and Oncology Follow Up Visit  FINTAN GRATER [id number] Jul 10, 1941 71 y.o. 08/09/2012 10:14 AM    CC: Peyton Najjar, MD    Principle Diagnosis: A 71 year old gentleman with leukocytosis and thrombocytosis, first evaluated in April 2012, etiology including a myeloproliferative disorder versus reactive process.  Interim History:  Mr. Narramore presents today for a followup visit.  This is a gentleman whom I saw for the first time back in April 2012.  He had presented with a white cell count of 19,000, hemoglobin 14, platelet count was 841.  His workup so far has been unrevealing for any specific myeloproliferative disorder.  His JAK2 mutations have been positive, indicating evidence against a myeloproliferative disorder. His BCR-Abl was negative.  He is asymptomatic at this point.  He is not reporting any chest pain, does not report any difficulty breathing, is not reporting any major changes in his performance status, is not reporting any change in his appetite or development of constitutional symptoms.  He had not reported any headaches or blurry vision, did not report any recent hospitalization or illnesses. He report tingling sensation in his lower extremities likely resembling neuropathy.   Medications: I have reviewed the patient's current medications. Current outpatient prescriptions:aspirin 81 MG tablet, Take 81 mg by mouth daily.  , Disp: , Rfl: ;  gabapentin (NEURONTIN) 100 MG capsule, Take 2 capsules (200 mg total) by mouth 3 (three) times daily., Disp: 180 capsule, Rfl: 3;  metFORMIN (GLUCOPHAGE) 500 MG tablet, Take 1 tablet (500 mg total) by mouth 2 (two) times daily with a meal., Disp: 180 tablet, Rfl: 0 Multiple Vitamin (MULTIVITAMIN) tablet, Take 1 tablet by mouth daily.  , Disp: , Rfl: ;  vitamin k 100 MCG tablet, Take 100 mcg by mouth daily.  , Disp: , Rfl:   Allergies: No Known Allergies  Past Medical History, Surgical history, Social history, and Family History were  reviewed and updated.  Review of Systems: Constitutional:  Negative for fever, chills, night sweats, anorexia, weight loss, pain. Cardiovascular: no chest pain or dyspnea on exertion Respiratory: no cough, shortness of breath, or wheezing Neurological: no TIA or stroke symptoms Dermatological: negative ENT: negative Skin: Negative. Gastrointestinal: no abdominal pain, change in bowel habits, or black or bloody stools Genito-Urinary: no dysuria, trouble voiding, or hematuria Hematological and Lymphatic: negative Breast: negative Musculoskeletal: negative Remaining ROS negative. Physical Exam: Blood pressure 121/54, pulse 60, temperature 98.9 F (37.2 C), temperature source Oral, resp. rate 20, height 5\' 5"  (1.651 m), weight 129 lb 12.8 oz (58.877 kg). ECOG: 1 General appearance: alert Head: Normocephalic, without obvious abnormality, atraumatic Neck: no adenopathy, no carotid bruit, no JVD, supple, symmetrical, trachea midline and thyroid not enlarged, symmetric, no tenderness/mass/nodules Lymph nodes: Cervical, supraclavicular, and axillary nodes normal. Heart:regular rate and rhythm, S1, S2 normal, no murmur, click, rub or gallop Lung:chest clear, no wheezing, rales, normal symmetric air entry Abdomin: soft, non-tender, without masses or organomegaly EXT:no erythema, induration, or nodules   Lab Results: Lab Results  Component Value Date   WBC 13.9* 08/09/2012   HGB 12.5* 08/09/2012   HCT 37.8* 08/09/2012   MCV 88.8 08/09/2012   PLT 663* 08/09/2012     Chemistry      Component Value Date/Time   NA 136 08/10/2011 0600   K 4.0 08/10/2011 0600   CL 101 08/10/2011 0600   CO2 30 08/10/2011 0600   BUN 17 08/10/2011 0600   CREATININE 0.67 08/10/2011 0600      Component Value Date/Time  CALCIUM 8.9 08/10/2011 0600   ALKPHOS 62 08/07/2011 0416   AST 21 08/07/2011 0416   ALT 10 08/07/2011 0416   BILITOT 0.5 08/07/2011 0416       Impression and Plan:  This is a pleasant  71 year old gentleman with the following issues:  1. Leukocytosis and thrombocytosis, differential diagnosis including a myeloproliferative disorder.  His BCR-ABL have been negative for chronic myelogenous leukemia.  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  At this point, given his platelet counts are dropping, his risk of thrombosis is still a little bit low, given that he has no history of any thrombotic events.  He is already on aspirin.  Will continue to watch it and again if his platelet cont go higher than 800 , then I will consider using hydroxyurea.  2. Anemia.  This is relatively mild.  His hemoglobin is 13.0.  Followup will be in 6 months' time. 3. Diabetic neuropathy: managed by his PCP Dr. Neva Seat.     [facility], MD 9/25/201310:14 AM

## 2012-08-09 NOTE — Telephone Encounter (Signed)
gv and made appt for pt...the patient requested Wed appt so that his son could bring him....sed

## 2012-11-09 IMAGING — CR DG CHEST 1V
1 series · 1 of 1 positions shown · non-contrast
Comparison: Frontal view 08/07/2011

CLINICAL DATA: Weakness

CHEST - 1 VIEW

[w chest lat]
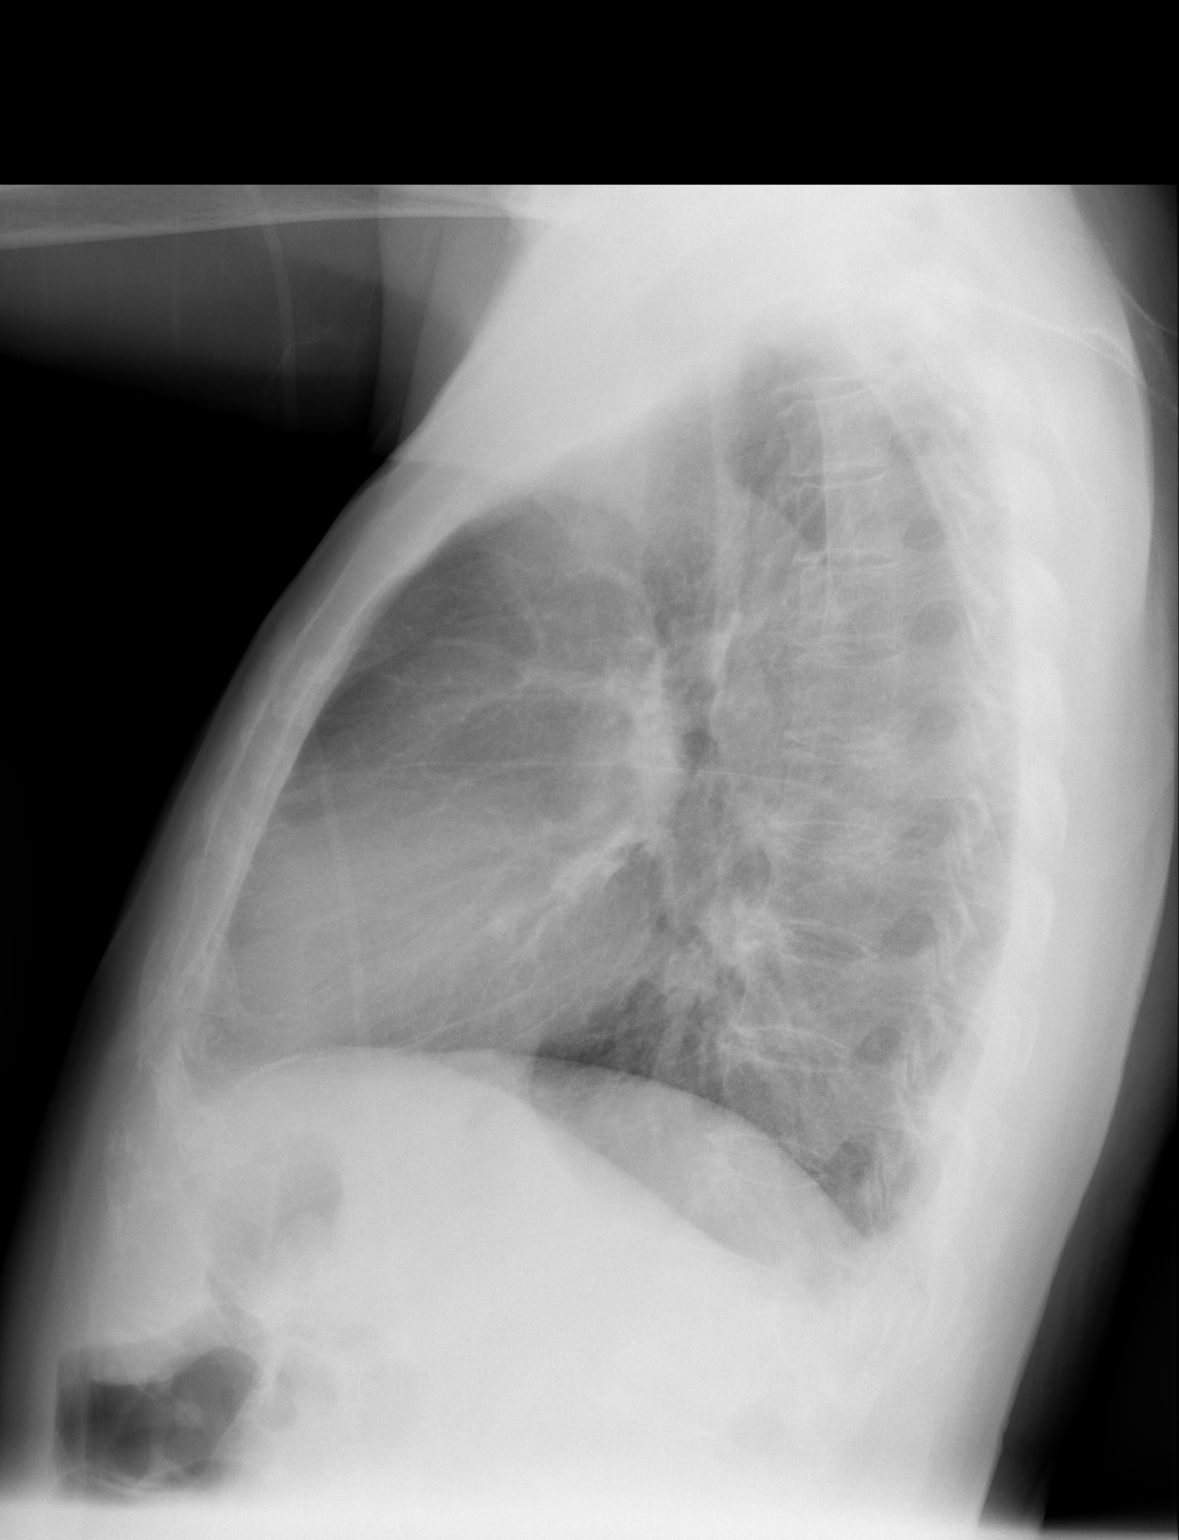

[1 of 1 positions shown; findings below may reference images not displayed]

FINDINGS: A single lateral view the thorax in comparison with the
frontal view demonstrates small bilateral pleural effusions.
Degenerative osteophytosis of the thoracic spine.
IMPRESSION: Small pleural effusions.

## 2012-11-09 IMAGING — CR DG ABDOMEN ACUTE W/ 1V CHEST
3 series · 3 of 3 positions shown · non-contrast
Comparison: None.

CLINICAL DATA: Inability to pass stool; difficulty voiding.  Lump
over upper back.

ACUTE ABDOMEN SERIES (ABDOMEN 2 VIEW & CHEST 1 VIEW)

[w chest pa]
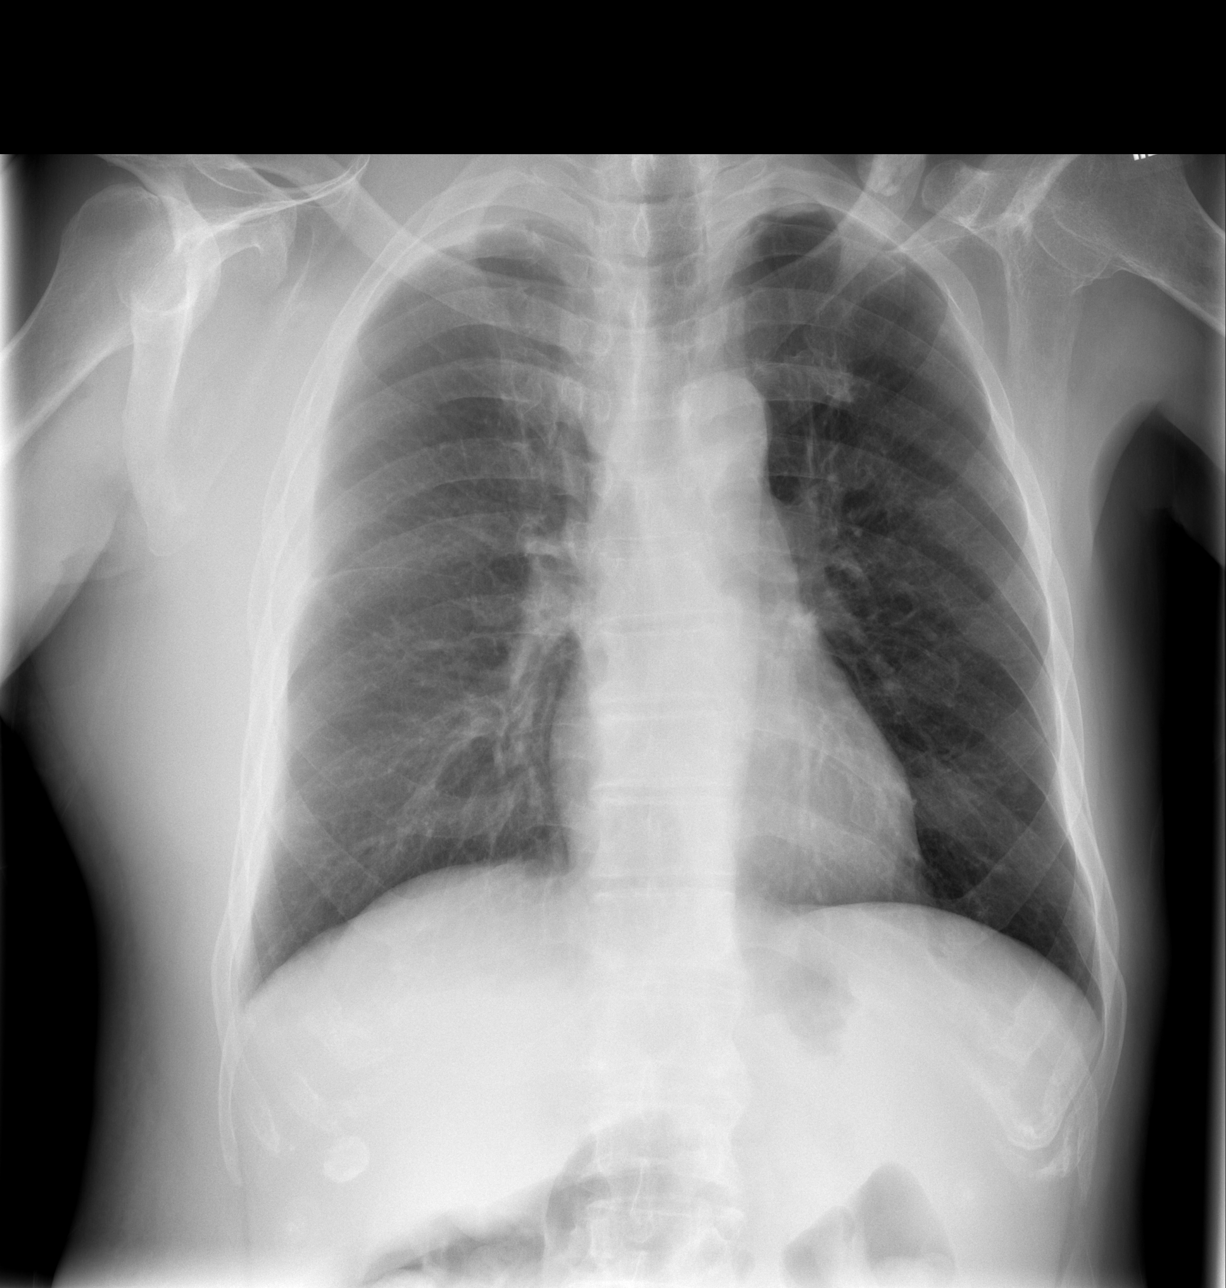

[w abdomen upright]
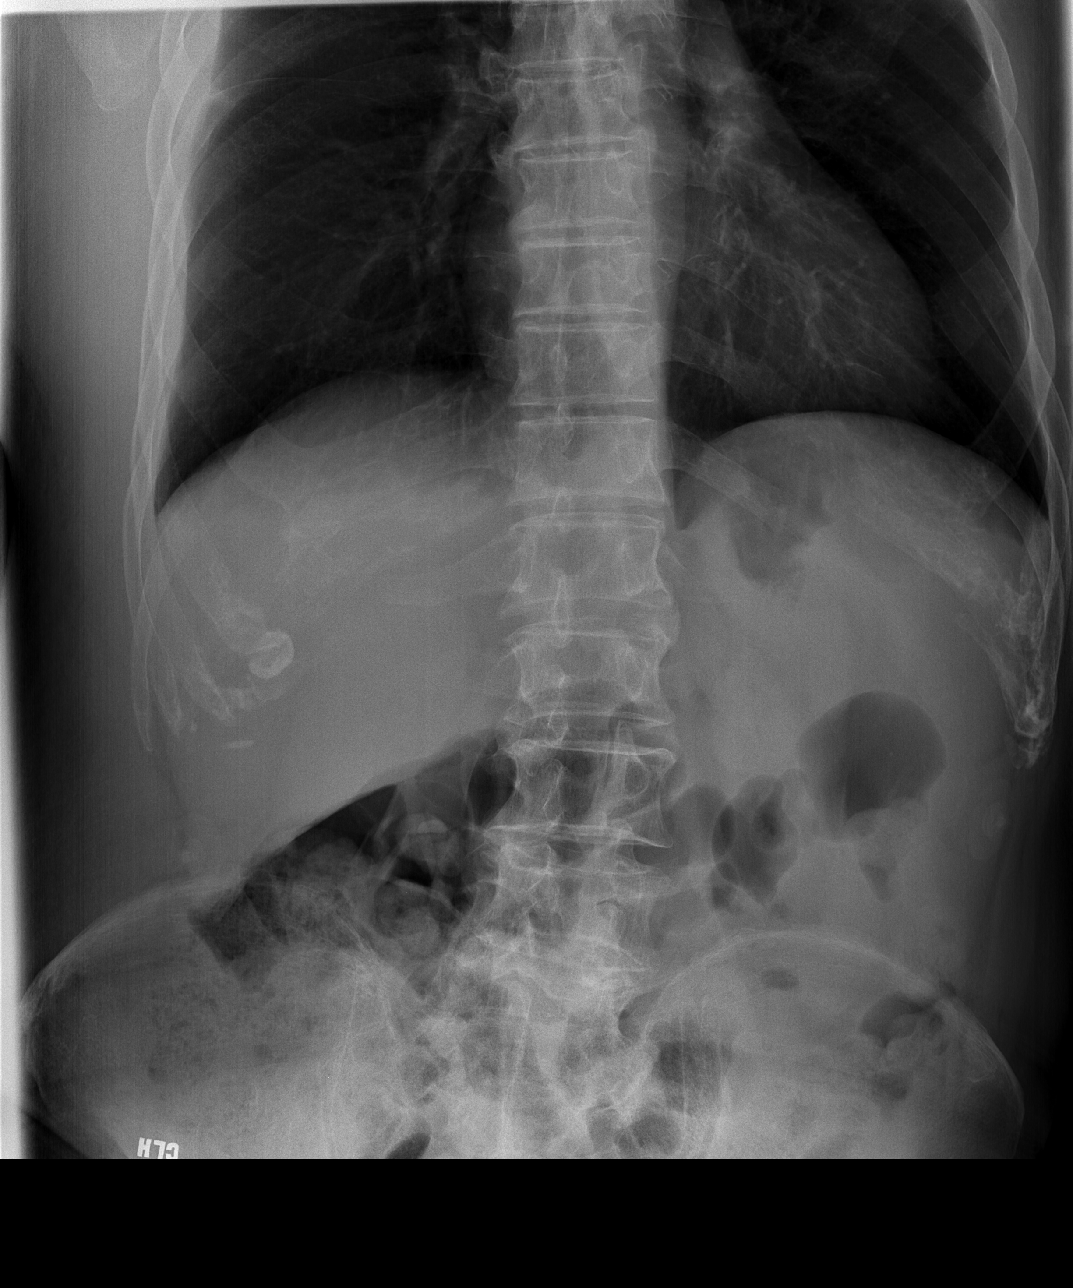

[t abdomen supine]
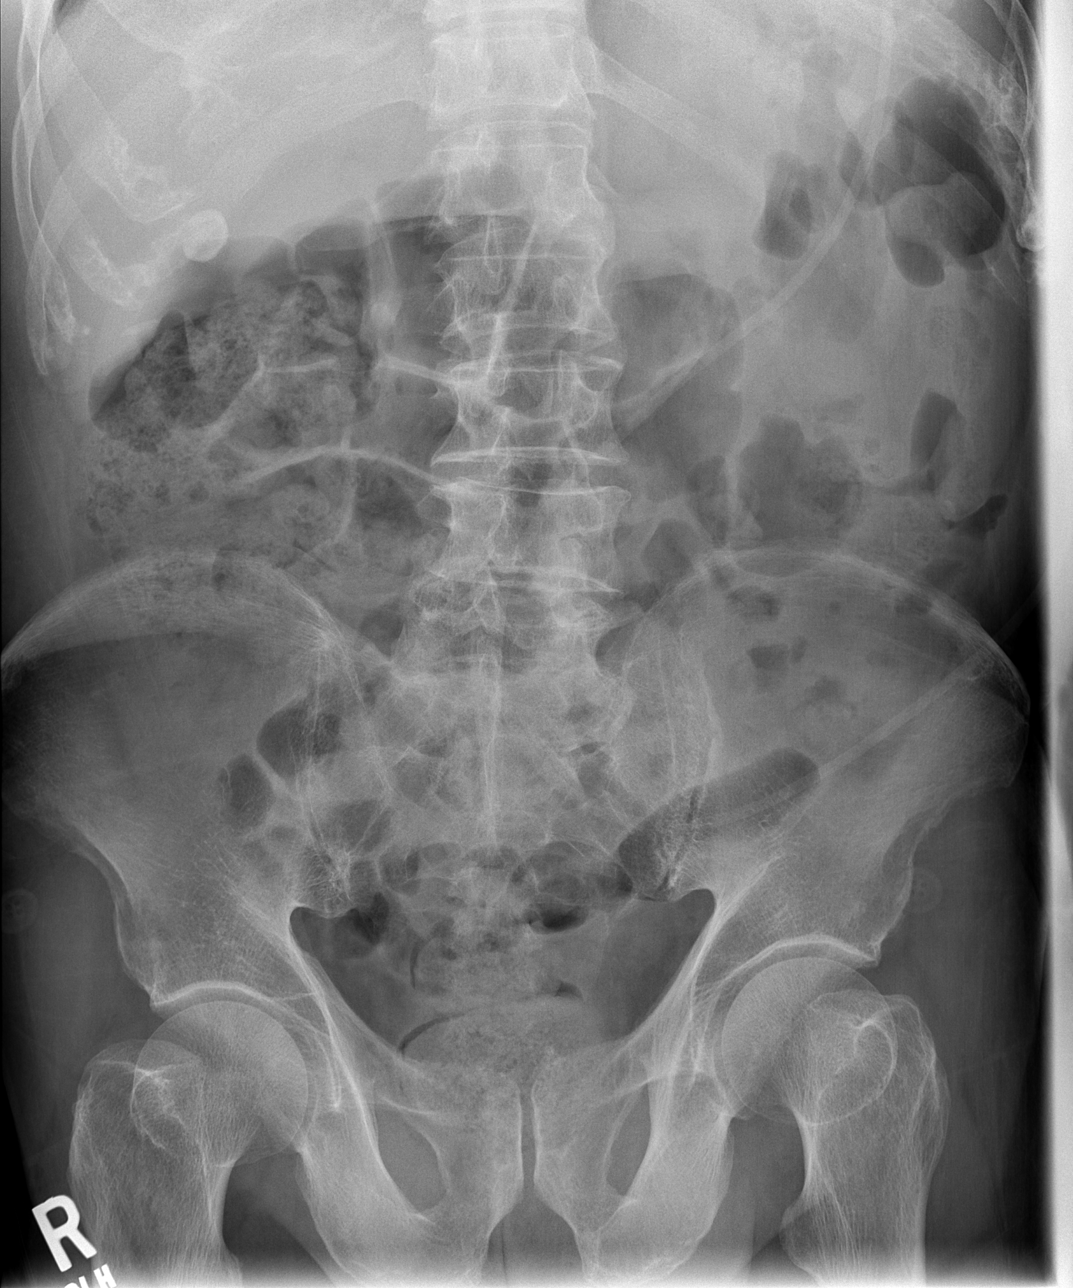

[3 of 3 positions shown; findings below may reference images not displayed]

FINDINGS: The lungs are well-aerated and clear.  There is no
evidence of focal opacification, pleural effusion or pneumothorax.
There is minimal nonspecific thickening of the right minor fissure.
Mild biapical pleural thickening is seen.  The cardiomediastinal
silhouette is within normal limits.

The visualized bowel gas pattern is unremarkable. Stool and air are
noted throughout the colon; there is no evidence of small bowel
dilatation to suggest obstruction.  No free intra-abdominal air is
identified on the provided upright view.

A rounded calcification at the right upper quadrant may reflect a
gallstone.

No acute osseous abnormalities are seen; the sacroiliac joints are
unremarkable in appearance. The scapulae are not well characterized
but appear grossly unremarkable.  There is chronic deformity of the
left clavicle.
IMPRESSION: 1.  Unremarkable bowel gas pattern; no free intra-abdominal air
seen.
2.  No acute cardiopulmonary process identified.
3.  Rounded calcification at the right upper quadrant may reflect a
gallstone.

## 2012-11-09 IMAGING — CT CT ABD-PELV W/O CM
2 of 4 series · 13 of 32 positions shown, 18 images · non-contrast
Comparison: None

CT CHEST

CLINICAL DATA: Evaluate for retroperitoneal hematoma

CT CHEST, ABDOMEN AND PELVIS WITHOUT CONTRAST
TECHNIQUE: Multidetector CT imaging of the chest, abdomen and
pelvis was performed following the standard protocol without IV
contrast.

[Series 400: sagittal · sagittal · 1.25mm/px · 8 of 125 slices shown, 13 images]
[im 14/125  soft-tissue]
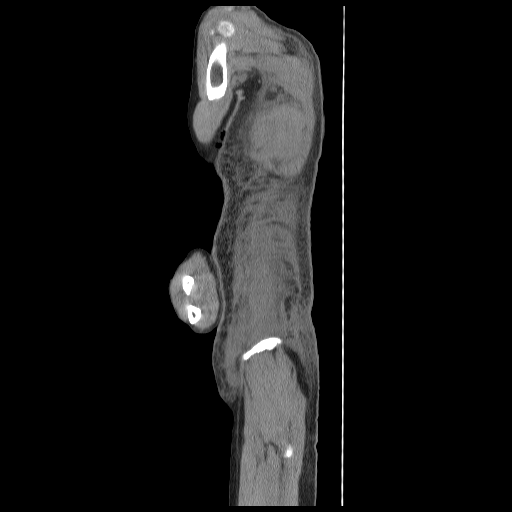
[im 14/125  lung]
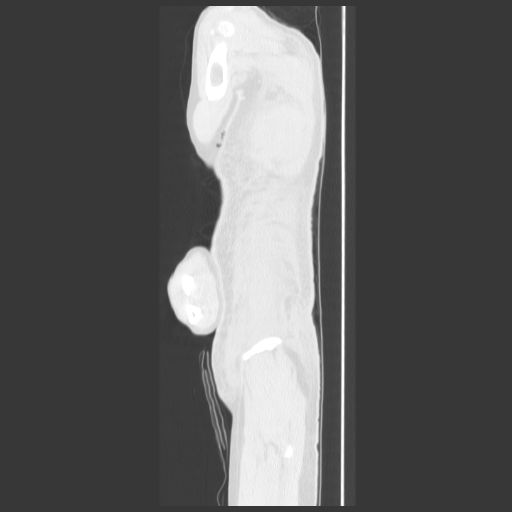
[im 14/125  bone]
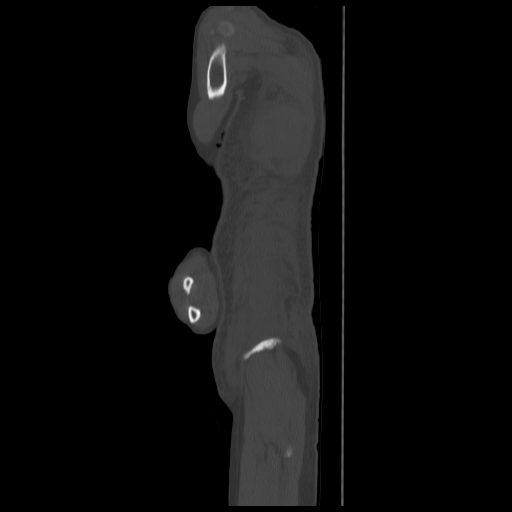
[im 28/125  soft-tissue]
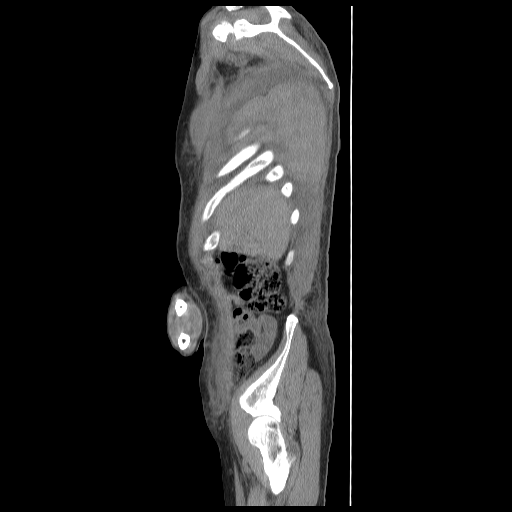
[im 28/125  lung]
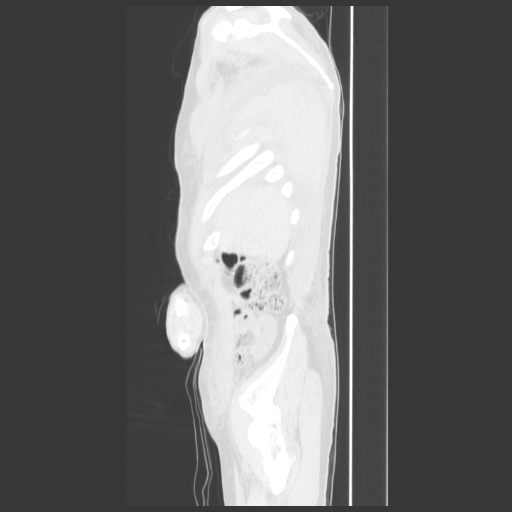
[im 42/125  soft-tissue]
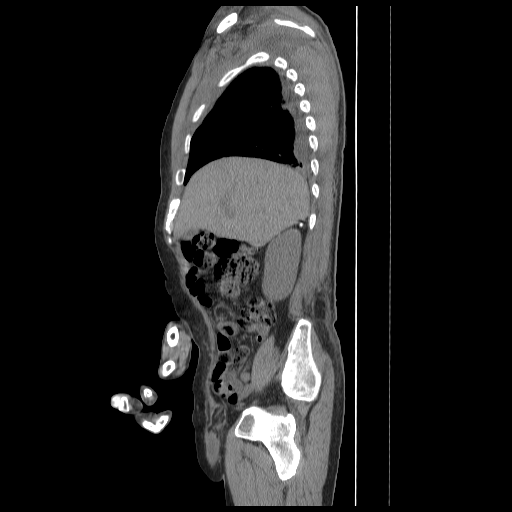
[im 42/125  lung]
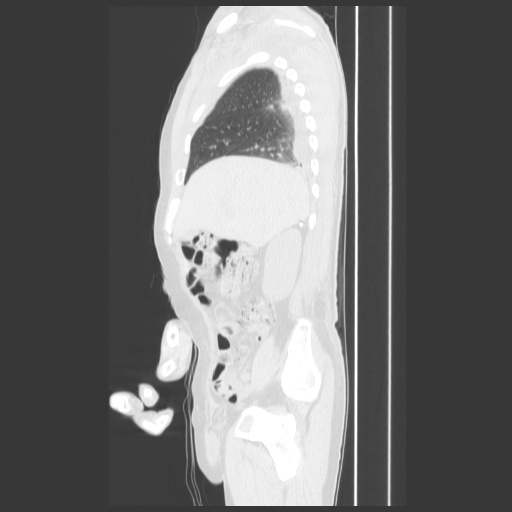
[im 56/125  soft-tissue]
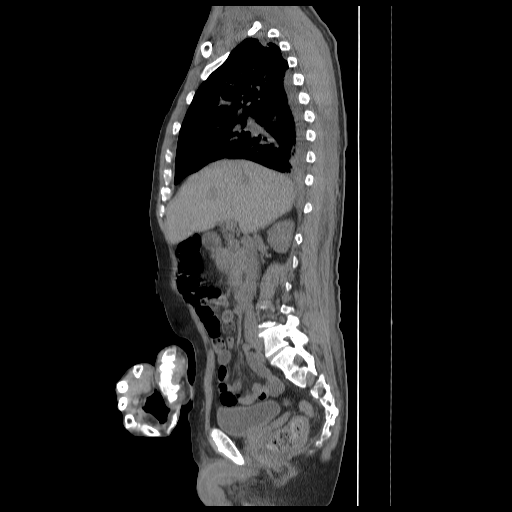
[im 56/125  lung]
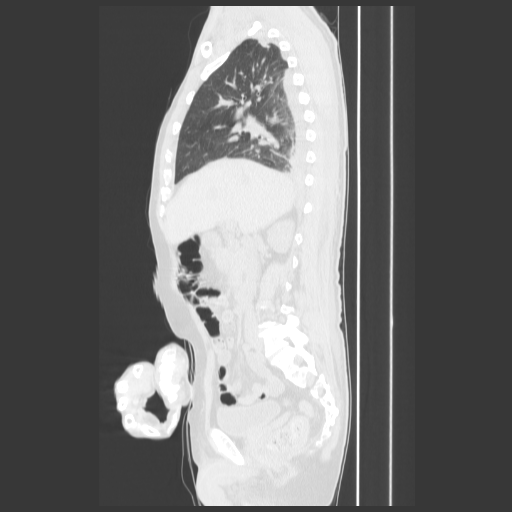
[im 69/125  soft-tissue]
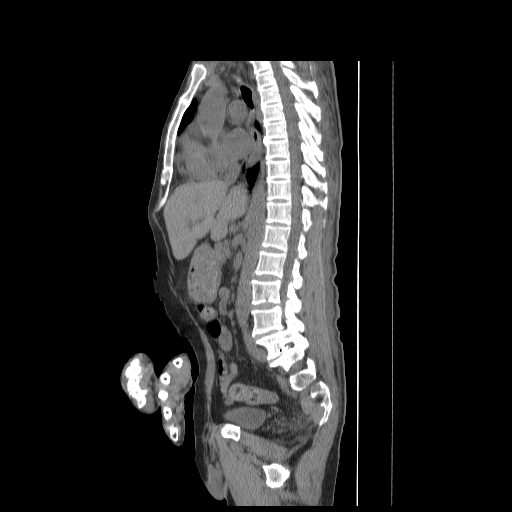
[im 83/125  soft-tissue]
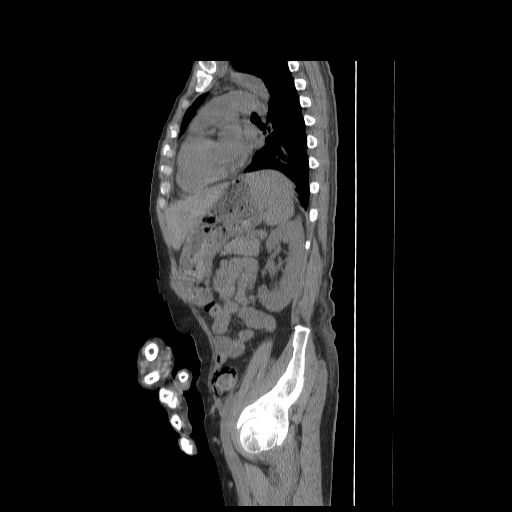
[im 97/125  soft-tissue]
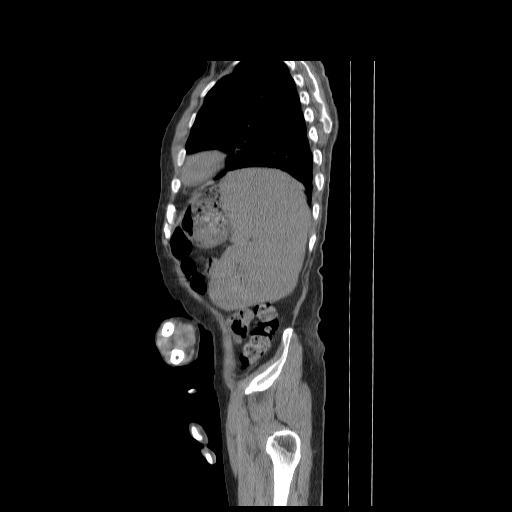
[im 111/125  soft-tissue]
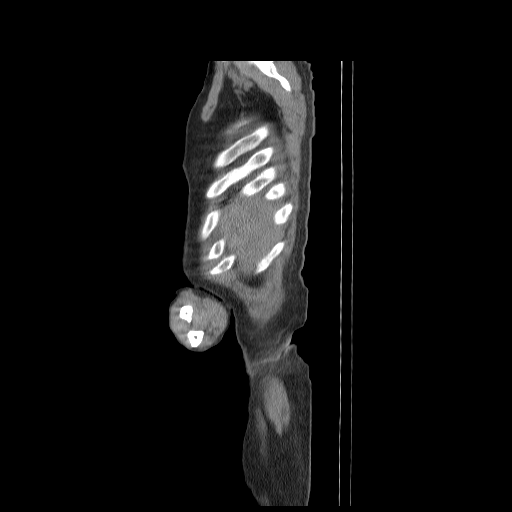

[Series 401: coronal · coronal · 1.25mm/px · 5 of 103 slices shown]
[im 15/103  soft-tissue]
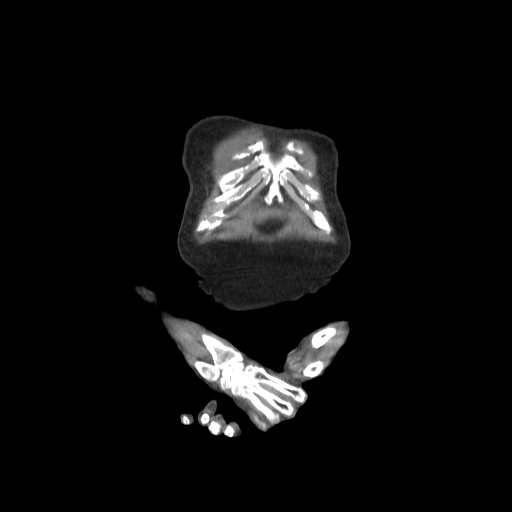
[im 30/103  soft-tissue]
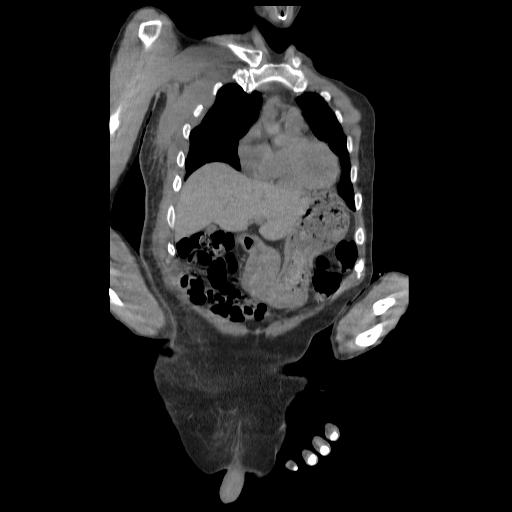
[im 44/103  soft-tissue]
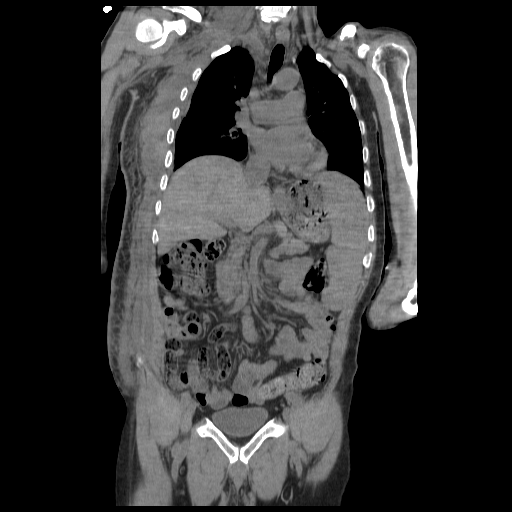
[im 59/103  soft-tissue]
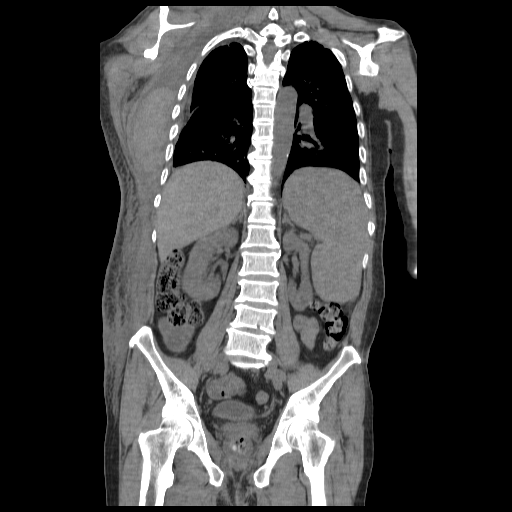
[im 73/103  soft-tissue]
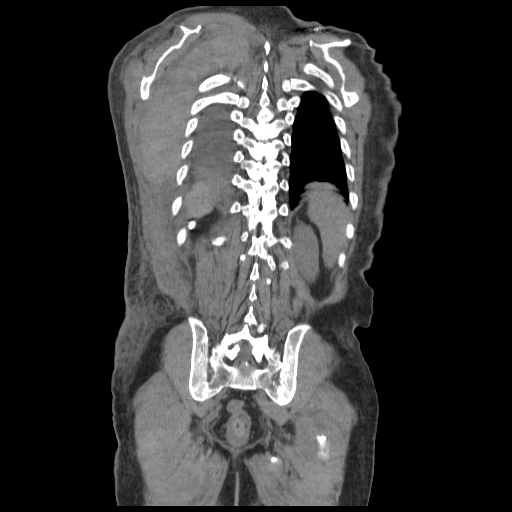

[13 of 32 positions shown; findings below may reference images not displayed]

FINDINGS: There is a large intramuscular hematoma within the right
posterior lateral chest wall.  This hematoma measures approximately
13.1 x 4.7 by 19.1 cm.

No enlarged axillary or supraclavicular lymph nodes.  There is no
mediastinal or hilar adenopathy.

There is a moderate-sized right pleural effusion with overlying
subsegmental atelectasis.  Left lung appears relatively clear.
Scar like densities noted in the lung apices.

Review of the visualized osseous structures shows a chronic
fracture deformity involving the distal left clavicle.  New
IMPRESSION: 1.  Large right posterior lateral intramuscular chest wall
hematoma.
2.  Moderate right pleural effusion.

CT ABDOMEN AND PELVIS
FINDINGS: The spleen is enlarged measuring 16.5 cm in craniocaudal
dimension.

No focal liver abnormality.

The adrenal glands are normal.

The pancreas is negative.

Gallbladder negative.  No biliary dilatation.

Pancreas appears within normal limits.

Normal appearance of the right kidney.  The left kidney negative.

No upper abdominal adenopathy.

No free fluid or fluid collections identified.

The small bowel loops have a normal course and caliber.

The colon is unremarkable.

Urinary bladder is below the knee.

Review of the visualized osseous structures is significant for
lumbar degenerative disc disease.
IMPRESSION: 1.  Splenomegaly.

2.  No evidence for retroperitoneal hematoma.

## 2012-12-21 ENCOUNTER — Telehealth: Payer: Self-pay

## 2012-12-21 MED ORDER — METFORMIN HCL 500 MG PO TABS: 500.0000 mg | ORAL_TABLET | Freq: Two times a day (BID) | ORAL | Status: DC

## 2012-12-21 NOTE — Telephone Encounter (Signed)
Pharmacist called and asked for RF of pt's Glucophage. Pt is overdue for OV. I gave OK for 30 day RF w/note that pt needs OV before RF runs out. Pharmacist also agreed to advise pt of need for OV as pt is at pharmacy at this time.

## 2013-01-27 ENCOUNTER — Other Ambulatory Visit: Payer: Self-pay | Admitting: Emergency Medicine

## 2013-01-27 ENCOUNTER — Ambulatory Visit: Payer: Medicare Other

## 2013-01-27 ENCOUNTER — Ambulatory Visit (INDEPENDENT_AMBULATORY_CARE_PROVIDER_SITE_OTHER): Payer: Medicare Other | Admitting: Emergency Medicine

## 2013-01-27 VITALS — BP 122/58 | HR 78 | Temp 98.0°F | Resp 16 | Ht 65.25 in | Wt 134.6 lb

## 2013-01-27 DIAGNOSIS — M25511 Pain in right shoulder: Secondary | ICD-10-CM

## 2013-01-27 DIAGNOSIS — Z125 Encounter for screening for malignant neoplasm of prostate: Secondary | ICD-10-CM

## 2013-01-27 DIAGNOSIS — D759 Disease of blood and blood-forming organs, unspecified: Secondary | ICD-10-CM | POA: Insufficient documentation

## 2013-01-27 LAB — POCT CBC
Lymph, poc: 2 (ref 0.6–3.4)
MCH, POC: 27.8 pg (ref 27–31.2)
MCHC: 30.7 g/dL — AB (ref 31.8–35.4)
MID (cbc): 0.8 (ref 0–0.9)
MPV: 10.2 fL (ref 0–99.8)
POC Granulocyte: 15.6 — AB (ref 2–6.9)
POC MID %: 4.3 %M (ref 0–12)
Platelet Count, POC: 682 10*3/uL — AB (ref 142–424)
WBC: 18.4 10*3/uL — AB (ref 4.6–10.2)

## 2013-01-27 LAB — COMPREHENSIVE METABOLIC PANEL
AST: 14 U/L (ref 0–37)
Alkaline Phosphatase: 57 U/L (ref 39–117)
BUN: 18 mg/dL (ref 6–23)
Creat: 0.88 mg/dL (ref 0.50–1.35)
Total Bilirubin: 0.5 mg/dL (ref 0.3–1.2)

## 2013-01-27 LAB — LIPID PANEL
HDL: 23 mg/dL — ABNORMAL LOW (ref >39)
LDL Cholesterol: 45 mg/dL (ref 0–99)
Total CHOL/HDL Ratio: 3.4 Ratio
VLDL: 10 mg/dL (ref 0–40)

## 2013-01-27 LAB — TSH: TSH: 4.67 u[IU]/mL — ABNORMAL HIGH (ref 0.350–4.500)

## 2013-01-27 MED ORDER — METFORMIN HCL 500 MG PO TABS: 500.0000 mg | ORAL_TABLET | Freq: Two times a day (BID) | ORAL | Status: DC

## 2013-01-27 MED ORDER — GABAPENTIN 100 MG PO CAPS: 200.0000 mg | ORAL_CAPSULE | Freq: Three times a day (TID) | ORAL | Status: DC

## 2013-01-27 NOTE — Progress Notes (Signed)
  Subjective:    Patient ID: [name], male    DOB: Mar 17, 1941, 72 y.o.   MRN: [id number]  HPI Pt now lives in [place] with son and his family. Son states his father has recently been c/o feeling prickly feeling in his legs and feet- he is out of pain medicine (Neurontin). On exam, Dr. 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 Pt cannot raise his Rt arm more than 90 degrees. Pt also has an enlarged spleen, noted during physical exam.   Review of Systems     Objective:   Physical Exam patient is alert and cooperative his neck is supple. Chest was clear to auscultation and percussion cardiac exam was irregular but no definite murmurs were heard the abdomen was soft the spleen is down about 5 fingerbreadths. There other areas of abdominal tenderness rectal exam reveals a normal prostate hemosure was done. Examination the right shoulder reveals very limited abduction of the shoulder. He is unable to abduct past 45.  [facility] reading (PRIMARY) by  Dr Cleta Alberts there are mild arthritic changes no fracture seen  Results for orders placed in visit on 01/27/13  POCT CBC      Result Value Range   WBC 18.4 (*) 4.6 - 10.2 K/uL   Lymph, poc 2.0  0.6 - 3.4   POC LYMPH PERCENT 11.0  10 - 50 %L   MID (cbc) 0.8  0 - 0.9   POC MID % 4.3  0 - 12 %M   POC Granulocyte 15.6 (*) 2 - 6.9   Granulocyte percent 84.7 (*) 37 - 80 %G   RBC 4.31 (*) 4.69 - 6.13 M/uL   Hemoglobin 12.0 (*) 14.1 - 18.1 g/dL   HCT, POC 19.1 (*) 47.8 - 53.7 %   MCV 90.7  80 - 97 fL   MCH, POC 27.8  27 - 31.2 pg   MCHC 30.7 (*) 31.8 - 35.4 g/dL   RDW, POC 29.5     Platelet Count, POC 682 (*) 142 - 424 K/uL   MPV 10.2  0 - 99.8 fL  IFOBT (OCCULT BLOOD)      Result Value Range   IFOBT Negative    GLUCOSE, POCT (MANUAL RESULT ENTRY)      Result Value Range   POC Glucose 142 (*) 70 - 99 mg/dl  POCT GLYCOSYLATED HEMOGLOBIN (HGB A1C)      Result Value Range   Hemoglobin A1C 6.4         Assessment & Plan:  The patient has  fairly impressive splenomegaly. He is known to have a blood dyscrasia. He is followed by the oncology service here in [place]. I have suggested they make an appointment for him to be seen in the near future. His weight has actually increased from a year ago from 126 to 134 so this is encouraging. Advise him to go ahead and make that appointment with his oncologist as soon as he can. They should also consider physical therapy in [place] to help with his frozen shoulder on the right.

## 2013-02-06 ENCOUNTER — Other Ambulatory Visit: Payer: Medicare Other | Admitting: Lab

## 2013-02-06 ENCOUNTER — Ambulatory Visit: Payer: Medicare Other | Admitting: Oncology

## 2013-02-07 ENCOUNTER — Telehealth: Payer: Self-pay | Admitting: Oncology

## 2013-02-07 ENCOUNTER — Ambulatory Visit (HOSPITAL_BASED_OUTPATIENT_CLINIC_OR_DEPARTMENT_OTHER): Payer: Medicare Other | Admitting: Oncology

## 2013-02-07 ENCOUNTER — Other Ambulatory Visit (HOSPITAL_BASED_OUTPATIENT_CLINIC_OR_DEPARTMENT_OTHER): Payer: Medicare Other | Admitting: Lab

## 2013-02-07 VITALS — BP 139/67 | HR 64 | Temp 98.4°F | Resp 20 | Ht 65.25 in | Wt 129.5 lb

## 2013-02-07 DIAGNOSIS — D473 Essential (hemorrhagic) thrombocythemia: Secondary | ICD-10-CM

## 2013-02-07 DIAGNOSIS — D72829 Elevated white blood cell count, unspecified: Secondary | ICD-10-CM

## 2013-02-07 DIAGNOSIS — D649 Anemia, unspecified: Secondary | ICD-10-CM

## 2013-02-07 LAB — CBC WITH DIFFERENTIAL/PLATELET
Eosinophils Absolute: 0.3 10*3/uL (ref 0.0–0.5)
LYMPH%: 6 % — ABNORMAL LOW (ref 14.0–49.0)
MCHC: 33.1 g/dL (ref 32.0–36.0)
MCV: 86.8 fL (ref 79.3–98.0)
MONO%: 4.9 % (ref 0.0–14.0)
NEUT#: 13.5 10*3/uL — ABNORMAL HIGH (ref 1.5–6.5)
Platelets: 705 10*3/uL — ABNORMAL HIGH (ref 140–400)
RBC: 4.45 10*6/uL (ref 4.20–5.82)

## 2013-02-07 LAB — COMPREHENSIVE METABOLIC PANEL (CC13)
Alkaline Phosphatase: 70 U/L (ref 40–150)
Creatinine: 0.9 mg/dL (ref 0.7–1.3)
Glucose: 165 mg/dl — ABNORMAL HIGH (ref 70–99)
Sodium: 142 mEq/L (ref 136–145)
Total Bilirubin: 0.45 mg/dL (ref 0.20–1.20)
Total Protein: 7.3 g/dL (ref 6.4–8.3)

## 2013-02-07 NOTE — Progress Notes (Signed)
Hematology and Oncology Follow Up Visit  [name] [id number] [birth year] 72 y.o. 02/07/2013 12:50 PM    CC: Peyton Najjar, MD    Principle Diagnosis: A 71 year old gentleman with leukocytosis and thrombocytosis, first evaluated in April 2012, etiology including a myeloproliferative disorder versus reactive process.  Interim History:  [name] presents today for a followup visit.  This is a gentleman whom I saw for the first time back in April 2012.  He had presented with a white cell count of 19,000, hemoglobin 14, platelet count was 841.  His workup so far has been unrevealing for any specific myeloproliferative disorder.  His JAK2 mutations have been positive, indicating evidence against a myeloproliferative disorder. His BCR-Abl was negative.  He is asymptomatic at this point.  He is not reporting any chest pain, does not report any difficulty breathing, is not reporting any major changes in his performance status, is not reporting any change in his appetite or development of constitutional symptoms.  He had not reported any headaches or blurry vision, did not report any recent hospitalization or illnesses. He report tingling sensation in his lower extremities likely resembling neuropathy.   Medications: I have reviewed the patient's current medications. Current outpatient prescriptions:aspirin 81 MG tablet, Take 81 mg by mouth daily.  , Disp: , Rfl: ;  gabapentin (NEURONTIN) 100 MG capsule, Take 2 capsules (200 mg total) by mouth 3 (three) times daily., Disp: 180 capsule, Rfl: 3;  metFORMIN (GLUCOPHAGE) 500 MG tablet, Take 1 tablet (500 mg total) by mouth 2 (two) times daily with a meal. NEEDS OFFICE VISIT FOR ADD'L REFILLS., Disp: 180 tablet, Rfl: 3 Multiple Vitamin (MULTIVITAMIN) tablet, Take 1 tablet by mouth daily.  , Disp: , Rfl: ;  vitamin k 100 MCG tablet, Take 100 mcg by mouth daily.  , Disp: , Rfl:   Allergies: No Known Allergies  Past Medical History, Surgical history, Social  history, and Family History were reviewed and updated.  Review of Systems: Constitutional:  Negative for fever, chills, night sweats, anorexia, weight loss, pain. Cardiovascular: no chest pain or dyspnea on exertion Respiratory: no cough, shortness of breath, or wheezing Neurological: no TIA or stroke symptoms Dermatological: negative ENT: negative Skin: Negative. Gastrointestinal: no abdominal pain, change in bowel habits, or black or bloody stools Genito-Urinary: no dysuria, trouble voiding, or hematuria Hematological and Lymphatic: negative Breast: negative Musculoskeletal: negative Remaining ROS negative.  Physical Exam: Blood pressure 139/67, pulse 64, temperature 98.4 F (36.9 C), temperature source Oral, resp. rate 20, height 5' 5.25" (1.657 m), weight 129 lb 8 oz (58.741 kg). ECOG: 1 General appearance: alert Head: Normocephalic, without obvious abnormality, atraumatic Neck: no adenopathy, no carotid bruit, no JVD, supple, symmetrical, trachea midline and thyroid not enlarged, symmetric, no tenderness/mass/nodules Lymph nodes: Cervical, supraclavicular, and axillary nodes normal. Heart:regular rate and rhythm, S1, S2 normal, no murmur, click, rub or gallop Lung:chest clear, no wheezing, rales, normal symmetric air entry Abdomen: soft, nondistended, normal bowel sounds and splenic tip palpable EXT:no erythema, induration, or nodules   Lab Results: Lab Results  Component Value Date   WBC 15.5* 02/07/2013   HGB 12.8* 02/07/2013   HCT 38.7 02/07/2013   MCV 86.8 02/07/2013   PLT 705* 02/07/2013     Chemistry      Component Value Date/Time   NA 142 02/07/2013 0942   NA 140 01/27/2013 1101   K 4.1 02/07/2013 0942   K 4.6 01/27/2013 1101   CL 105 02/07/2013 0942   CL 104 01/27/2013  1101   CO2 29 02/07/2013 0942   CO2 29 01/27/2013 1101   BUN 16.6 02/07/2013 0942   BUN 18 01/27/2013 1101   CREATININE 0.9 02/07/2013 0942   CREATININE 0.88 01/27/2013 1101   CREATININE 0.67  08/10/2011 0600      Component Value Date/Time   CALCIUM 9.4 02/07/2013 0942   CALCIUM 9.5 01/27/2013 1101   ALKPHOS 70 02/07/2013 0942   ALKPHOS 57 01/27/2013 1101   AST 13 02/07/2013 0942   AST 14 01/27/2013 1101   ALT 12 02/07/2013 0942   ALT 9 01/27/2013 1101   BILITOT 0.45 02/07/2013 0942   BILITOT 0.5 01/27/2013 1101       Impression and Plan:  This is a pleasant 72 year old gentleman with the following issues:  1. Leukocytosis and thrombocytosis, differential diagnosis including a myeloproliferative disorder.  His BCR-ABL have been negative for chronic myelogenous leukemia.  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  At this point, given his platelet counts are dropping, his risk of thrombosis is still a little bit low, given that he has no history of any thrombotic events.  He is already on aspirin.  Will continue to watch it and again if his platelet cont go higher than 800 , then I will consider using hydroxyurea.  2. Anemia.  This is relatively mild.  His hemoglobin is 13.0.  Followup will be in 6 months' time. 3. Diabetic neuropathy: managed by his PCP.  4. Follow-up. In 6 months    Clenton Pare 3/26/201412:50 PM

## 2013-07-18 ENCOUNTER — Telehealth: Payer: Self-pay | Admitting: Oncology

## 2013-07-18 ENCOUNTER — Other Ambulatory Visit (HOSPITAL_BASED_OUTPATIENT_CLINIC_OR_DEPARTMENT_OTHER): Payer: Medicare Other | Admitting: Lab

## 2013-07-18 ENCOUNTER — Ambulatory Visit (HOSPITAL_BASED_OUTPATIENT_CLINIC_OR_DEPARTMENT_OTHER): Payer: Medicare Other | Admitting: Oncology

## 2013-07-18 VITALS — BP 119/84 | HR 73 | Temp 98.6°F | Resp 18 | Ht 65.0 in | Wt 130.7 lb

## 2013-07-18 DIAGNOSIS — D473 Essential (hemorrhagic) thrombocythemia: Secondary | ICD-10-CM

## 2013-07-18 DIAGNOSIS — D72829 Elevated white blood cell count, unspecified: Secondary | ICD-10-CM

## 2013-07-18 DIAGNOSIS — D649 Anemia, unspecified: Secondary | ICD-10-CM

## 2013-07-18 DIAGNOSIS — D759 Disease of blood and blood-forming organs, unspecified: Secondary | ICD-10-CM

## 2013-07-18 DIAGNOSIS — D75839 Thrombocytosis, unspecified: Secondary | ICD-10-CM

## 2013-07-18 LAB — CBC WITH DIFFERENTIAL/PLATELET
EOS%: 1.4 % (ref 0.0–7.0)
MCH: 28.3 pg (ref 27.2–33.4)
MCV: 85.8 fL (ref 79.3–98.0)
MONO%: 4.7 % (ref 0.0–14.0)
NEUT#: 13.9 10*3/uL — ABNORMAL HIGH (ref 1.5–6.5)
RBC: 4.35 10*6/uL (ref 4.20–5.82)
RDW: 14.6 % (ref 11.0–14.6)
lymph#: 0.9 10*3/uL (ref 0.9–3.3)

## 2013-07-18 LAB — COMPREHENSIVE METABOLIC PANEL (CC13)
ALT: 9 U/L (ref 0–55)
AST: 12 U/L (ref 5–34)
Albumin: 4.4 g/dL (ref 3.5–5.0)
Alkaline Phosphatase: 63 U/L (ref 40–150)
Potassium: 4.4 mEq/L (ref 3.5–5.1)
Sodium: 143 mEq/L (ref 136–145)
Total Protein: 7.2 g/dL (ref 6.4–8.3)

## 2013-07-18 NOTE — Telephone Encounter (Signed)
Pt wants end of [facility] 2014 lab and MD appt, gave pt appt

## 2013-07-18 NOTE — Progress Notes (Signed)
Hematology and Oncology Follow Up Visit  JABRIEL VANDUYNE [id number] 21-Aug-1941 72 y.o. 07/18/2013 10:53 AM    CC: Peyton Najjar, MD    Principle Diagnosis: A 72 year old gentleman with leukocytosis and thrombocytosis, first evaluated in April 2012, etiology including a myeloproliferative disorder versus reactive process.  Current therapy: Observation and surveillance.  Interim History:  Mr. Swicegood presents today for a followup visit.  This is a gentleman whom I saw for the first time back in April 2012.  He had presented with a white cell count of 19,000, hemoglobin 14, platelet count was 841.  His workup so far has been unrevealing for any specific myeloproliferative disorder.  His JAK2 mutations have been positive, indicating evidence against a myeloproliferative disorder. His BCR-Abl was negative.  He is asymptomatic at this point.  He is not reporting any chest pain, does not report any difficulty breathing, is not reporting any major changes in his performance status, is not reporting any change in his appetite or development of constitutional symptoms.  He had not reported any headaches or blurry vision, did not report any recent hospitalization or illnesses. He report tingling sensation in his lower extremities likely resembling neuropathy. No thrombosis or bleeding since last visit.   Medications: I have reviewed the patient's current medications. Current Outpatient Prescriptions  Medication Sig Dispense Refill  . aspirin 81 MG tablet Take 81 mg by mouth daily.        Marland Kitchen gabapentin (NEURONTIN) 100 MG capsule Take 2 capsules (200 mg total) by mouth 3 (three) times daily.  180 capsule  3  . metFORMIN (GLUCOPHAGE) 500 MG tablet Take 1 tablet (500 mg total) by mouth 2 (two) times daily with a meal. NEEDS OFFICE VISIT FOR ADD'L REFILLS.  180 tablet  3  . Multiple Vitamin (MULTIVITAMIN) tablet Take 1 tablet by mouth daily.        . vitamin k 100 MCG tablet Take 100 mcg by mouth daily.          No current facility-administered medications for this visit.    Allergies: No Known Allergies  Past Medical History, Surgical history, Social history, and Family History were reviewed and updated.  Review of Systems:  Remaining ROS negative.  Physical Exam: Blood pressure 119/84, pulse 73, temperature 98.6 F (37 C), temperature source Oral, resp. rate 18, height 5\' 5"  (1.651 m), weight 130 lb 11.2 oz (59.285 kg), SpO2 98.00%. ECOG: 1 General appearance: alert Head: Normocephalic, without obvious abnormality, atraumatic Neck: no adenopathy, no carotid bruit, no JVD, supple, symmetrical, trachea midline and thyroid not enlarged, symmetric, no tenderness/mass/nodules Lymph nodes: Cervical, supraclavicular, and axillary nodes normal. Heart:regular rate and rhythm, S1, S2 normal, no murmur, click, rub or gallop Lung:chest clear, no wheezing, rales, normal symmetric air entry Abdomen: soft, nondistended, normal bowel sounds and splenic tip palpable EXT:no erythema, induration, or nodules   Lab Results: Lab Results  Component Value Date   WBC 16.0* 07/18/2013   HGB 12.3* 07/18/2013   HCT 37.3* 07/18/2013   MCV 85.8 07/18/2013   PLT 575* 07/18/2013     Chemistry      Component Value Date/Time   NA 143 07/18/2013 1004   NA 140 01/27/2013 1101   K 4.4 07/18/2013 1004   K 4.6 01/27/2013 1101   CL 105 02/07/2013 0942   CL 104 01/27/2013 1101   CO2 26 07/18/2013 1004   CO2 29 01/27/2013 1101   BUN 14.8 07/18/2013 1004   BUN 18 01/27/2013 1101   CREATININE  0.9 07/18/2013 1004   CREATININE 0.88 01/27/2013 1101   CREATININE 0.67 08/10/2011 0600      Component Value Date/Time   CALCIUM 9.3 07/18/2013 1004   CALCIUM 9.5 01/27/2013 1101   ALKPHOS 63 07/18/2013 1004   ALKPHOS 57 01/27/2013 1101   AST 12 07/18/2013 1004   AST 14 01/27/2013 1101   ALT 9 07/18/2013 1004   ALT 9 01/27/2013 1101   BILITOT 0.56 07/18/2013 1004   BILITOT 0.5 01/27/2013 1101       Impression and Plan:  This is a pleasant  72 year old gentleman with the following issues:  1. Leukocytosis and thrombocytosis, differential diagnosis including a myeloproliferative disorder.  His BCR-ABL have been negative for chronic myelogenous leukemia.  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  At this point, given his platelet counts are dropping, his risk of thrombosis is still a little bit low, given that he has no history of any thrombotic events.  He is already on aspirin.  Will continue to watch it and again if his platelet cont go higher than 800 , then I will consider using hydroxyurea.  2. Anemia.  This is relatively mild.  His hemoglobin is 12.3.  Followup will be in 6 months' time. 3. Diabetic neuropathy: managed by his PCP.  4. Follow-up. In 6 months    Tanaia Hawkey 9/3/201410:53 AM

## 2014-02-08 ENCOUNTER — Telehealth: Payer: Self-pay | Admitting: Oncology

## 2014-02-08 NOTE — Telephone Encounter (Signed)
PT CALLED TO R/S 3/31 APP TO 4/8. PT HAS NEW D/T.

## 2014-02-12 ENCOUNTER — Ambulatory Visit: Payer: Medicare Other | Admitting: Oncology

## 2014-02-12 ENCOUNTER — Other Ambulatory Visit: Payer: Medicare Other

## 2014-02-20 ENCOUNTER — Other Ambulatory Visit (HOSPITAL_BASED_OUTPATIENT_CLINIC_OR_DEPARTMENT_OTHER): Payer: Medicare HMO

## 2014-02-20 ENCOUNTER — Ambulatory Visit (HOSPITAL_BASED_OUTPATIENT_CLINIC_OR_DEPARTMENT_OTHER): Payer: Medicare HMO | Admitting: Oncology

## 2014-02-20 ENCOUNTER — Encounter: Payer: Self-pay | Admitting: Oncology

## 2014-02-20 ENCOUNTER — Telehealth: Payer: Self-pay | Admitting: Oncology

## 2014-02-20 VITALS — BP 134/45 | HR 72 | Temp 99.0°F | Resp 18 | Ht 65.0 in | Wt 131.6 lb

## 2014-02-20 DIAGNOSIS — D72829 Elevated white blood cell count, unspecified: Secondary | ICD-10-CM

## 2014-02-20 DIAGNOSIS — D649 Anemia, unspecified: Secondary | ICD-10-CM

## 2014-02-20 DIAGNOSIS — D75839 Thrombocytosis, unspecified: Secondary | ICD-10-CM

## 2014-02-20 DIAGNOSIS — D759 Disease of blood and blood-forming organs, unspecified: Secondary | ICD-10-CM

## 2014-02-20 DIAGNOSIS — D473 Essential (hemorrhagic) thrombocythemia: Secondary | ICD-10-CM

## 2014-02-20 LAB — CBC WITH DIFFERENTIAL/PLATELET
BASO%: 1 % (ref 0.0–2.0)
BASOS ABS: 0.2 10*3/uL — AB (ref 0.0–0.1)
EOS%: 1.8 % (ref 0.0–7.0)
Eosinophils Absolute: 0.3 10*3/uL (ref 0.0–0.5)
HEMATOCRIT: 38.5 % (ref 38.4–49.9)
HEMOGLOBIN: 12.4 g/dL — AB (ref 13.0–17.1)
LYMPH#: 1 10*3/uL (ref 0.9–3.3)
LYMPH%: 6.1 % — ABNORMAL LOW (ref 14.0–49.0)
MCH: 27.8 pg (ref 27.2–33.4)
MCHC: 32.1 g/dL (ref 32.0–36.0)
MCV: 86.7 fL (ref 79.3–98.0)
MONO#: 0.6 10*3/uL (ref 0.1–0.9)
MONO%: 3.8 % (ref 0.0–14.0)
NEUT%: 87.3 % — AB (ref 39.0–75.0)
NEUTROS ABS: 14.4 10*3/uL — AB (ref 1.5–6.5)
Platelets: 605 10*3/uL — ABNORMAL HIGH (ref 140–400)
RBC: 4.45 10*6/uL (ref 4.20–5.82)
RDW: 14.7 % — AB (ref 11.0–14.6)
WBC: 16.5 10*3/uL — AB (ref 4.0–10.3)

## 2014-02-20 LAB — COMPREHENSIVE METABOLIC PANEL (CC13)
ALBUMIN: 4.6 g/dL (ref 3.5–5.0)
ANION GAP: 8 meq/L (ref 3–11)
AST: 14 U/L (ref 5–34)
Alkaline Phosphatase: 68 U/L (ref 40–150)
BUN: 11.8 mg/dL (ref 7.0–26.0)
CALCIUM: 9.2 mg/dL (ref 8.4–10.4)
CHLORIDE: 106 meq/L (ref 98–109)
CO2: 28 meq/L (ref 22–29)
CREATININE: 0.9 mg/dL (ref 0.7–1.3)
Glucose: 206 mg/dl — ABNORMAL HIGH (ref 70–140)
POTASSIUM: 4.4 meq/L (ref 3.5–5.1)
Sodium: 142 mEq/L (ref 136–145)
Total Bilirubin: 0.42 mg/dL (ref 0.20–1.20)
Total Protein: 7.3 g/dL (ref 6.4–8.3)

## 2014-02-20 NOTE — Progress Notes (Signed)
Hematology and Oncology Follow Up Visit  [name] [id number] 07/22/1941 73 y.o. 02/20/2014 11:41 AM    CC: [name], MD    Principle Diagnosis: A 73 year old gentleman with leukocytosis and thrombocytosis, first evaluated in April 2012, etiology including a myeloproliferative disorder versus reactive process.  Current therapy: Observation and surveillance.  Interim History:  [name] presents today for a followup visit . He had presented with a white cell count of 19,000, hemoglobin 14, platelet count was 841.  His workup showed a JAK2 mutations have been positive, indicating evidence against a myeloproliferative disorder. His BCR-Abl was negative.  He is asymptomatic at this point.  He is not reporting any chest pain, does not report any difficulty breathing, is not reporting any major changes in his performance status, is not reporting any change in his appetite or development of constitutional symptoms.  He had not reported any headaches or blurry vision, did not report any recent hospitalization or illnesses. He report tingling sensation in his lower extremities likely resembling neuropathy. No thrombosis or bleeding since last visit. He has not reported any hospitalizations or infections. Continue be in a rather excellent health and shape at this time.  Medications: I have reviewed the patient's current medications. Current Outpatient Prescriptions  Medication Sig Dispense Refill  . aspirin 81 MG tablet Take 81 mg by mouth daily.        Marland Kitchen gabapentin (NEURONTIN) 100 MG capsule Take 2 capsules (200 mg total) by mouth 3 (three) times daily.  180 capsule  3  . metFORMIN (GLUCOPHAGE) 500 MG tablet Take 1 tablet (500 mg total) by mouth 2 (two) times daily with a meal. NEEDS OFFICE VISIT FOR ADD'L REFILLS.  180 tablet  3  . Multiple Vitamin (MULTIVITAMIN) tablet Take 1 tablet by mouth daily.        . vitamin k 100 MCG tablet Take 100 mcg by mouth daily.         No current  facility-administered medications for this visit.    Allergies: No Known Allergies  Past Medical History, Surgical history, Social history, and Family History were reviewed and updated.  Review of Systems:  Remaining ROS negative.  Physical Exam: Blood pressure 134/45, pulse 72, temperature 99 F (37.2 C), temperature source Oral, resp. rate 18, height 5\' 5"  (1.651 m), weight 131 lb 9.6 oz (59.693 kg), SpO2 97.00%. ECOG: 1 General appearance: alert awake appeared in no active distress. Head: Normocephalic, without obvious abnormality, atraumatic Neck: no adenopathy, no carotid bruit, no JVD, supple, symmetrical, trachea midline and thyroid not enlarged, symmetric, no tenderness/mass/nodules Lymph nodes: Cervical, supraclavicular, and axillary nodes normal. Heart:regular rate and rhythm, S1, S2 normal, no murmur, click, rub or gallop Lung:chest clear, no wheezing, rales, normal symmetric air entry Abdomen: soft, nondistended, normal bowel sounds and splenic tip palpable EXT:no erythema, induration, or nodules   Lab Results: Lab Results  Component Value Date   WBC 16.5* 02/20/2014   HGB 12.4* 02/20/2014   HCT 38.5 02/20/2014   MCV 86.7 02/20/2014   PLT 605* 02/20/2014     Chemistry      Component Value Date/Time   NA 142 02/20/2014 1046   NA 140 01/27/2013 1101   K 4.4 02/20/2014 1046   K 4.6 01/27/2013 1101   CL 105 02/07/2013 0942   CL 104 01/27/2013 1101   CO2 28 02/20/2014 1046   CO2 29 01/27/2013 1101   BUN 11.8 02/20/2014 1046   BUN 18 01/27/2013 1101   CREATININE 0.9  02/20/2014 1046   CREATININE 0.88 01/27/2013 1101   CREATININE 0.67 08/10/2011 0600      Component Value Date/Time   CALCIUM 9.2 02/20/2014 1046   CALCIUM 9.5 01/27/2013 1101   ALKPHOS 68 02/20/2014 1046   ALKPHOS 57 01/27/2013 1101   AST 14 02/20/2014 1046   AST 14 01/27/2013 1101   ALT <6 02/20/2014 1046   ALT 9 01/27/2013 1101   BILITOT 0.42 02/20/2014 1046   BILITOT 0.5 01/27/2013 1101       Impression and Plan:  This  is a pleasant 73 year old gentleman with the following issues:   1. Leukocytosis and thrombocytosis, differential diagnosis including a myeloproliferative disorder.  His BCR-ABL have been negative for chronic myelogenous leukemia.  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  At this point, given his platelet counts  are relatively stable and he is currently on aspirin. I do not see any need to start a platelet lowering agent I will continue to monitor him closely. We will consider that if his blood counts close to 800,000. 2. Anemia.  This is relatively mild.  His hemoglobin is 12.4 and continues to be stable..  3. Diabetic neuropathy: managed by his PCP.  4. Follow-up. In 6 months    [name] 4/8/201511:41 AM

## 2014-02-20 NOTE — Telephone Encounter (Signed)
Gave pt appt for lab and md  for October 2015

## 2014-04-28 ENCOUNTER — Other Ambulatory Visit: Payer: Self-pay | Admitting: Emergency Medicine

## 2014-05-02 ENCOUNTER — Ambulatory Visit (INDEPENDENT_AMBULATORY_CARE_PROVIDER_SITE_OTHER): Payer: Medicare PPO | Admitting: Family Medicine

## 2014-05-02 VITALS — BP 126/72 | HR 67 | Temp 97.8°F | Resp 16 | Ht 66.0 in | Wt 129.6 lb

## 2014-05-02 DIAGNOSIS — Z0000 Encounter for general adult medical examination without abnormal findings: Secondary | ICD-10-CM

## 2014-05-02 DIAGNOSIS — D759 Disease of blood and blood-forming organs, unspecified: Secondary | ICD-10-CM

## 2014-05-02 DIAGNOSIS — E1149 Type 2 diabetes mellitus with other diabetic neurological complication: Secondary | ICD-10-CM

## 2014-05-02 DIAGNOSIS — Z139 Encounter for screening, unspecified: Secondary | ICD-10-CM

## 2014-05-02 DIAGNOSIS — S335XXA Sprain of ligaments of lumbar spine, initial encounter: Secondary | ICD-10-CM

## 2014-05-02 DIAGNOSIS — M545 Low back pain, unspecified: Secondary | ICD-10-CM

## 2014-05-02 DIAGNOSIS — S39012A Strain of muscle, fascia and tendon of lower back, initial encounter: Secondary | ICD-10-CM

## 2014-05-02 DIAGNOSIS — E119 Type 2 diabetes mellitus without complications: Secondary | ICD-10-CM

## 2014-05-02 DIAGNOSIS — E1142 Type 2 diabetes mellitus with diabetic polyneuropathy: Secondary | ICD-10-CM

## 2014-05-02 DIAGNOSIS — M25579 Pain in unspecified ankle and joints of unspecified foot: Secondary | ICD-10-CM

## 2014-05-02 LAB — POCT CBC
Granulocyte percent: 84.5 %G — AB (ref 37–80)
HEMATOCRIT: 41.8 % — AB (ref 43.5–53.7)
Hemoglobin: 13.1 g/dL — AB (ref 14.1–18.1)
Lymph, poc: 2 (ref 0.6–3.4)
MCH, POC: 27.9 pg (ref 27–31.2)
MCHC: 31.3 g/dL — AB (ref 31.8–35.4)
MCV: 88.9 fL (ref 80–97)
MID (cbc): 0.8 (ref 0–0.9)
MPV: 10.7 fL (ref 0–99.8)
PLATELET COUNT, POC: 697 10*3/uL — AB (ref 142–424)
POC GRANULOCYTE: 15.2 — AB (ref 2–6.9)
POC LYMPH %: 11.2 % (ref 10–50)
POC MID %: 4.3 %M (ref 0–12)
RBC: 4.7 M/uL (ref 4.69–6.13)
RDW, POC: 15.2 %
WBC: 18 10*3/uL — AB (ref 4.6–10.2)

## 2014-05-02 LAB — POCT URINALYSIS DIPSTICK
BILIRUBIN UA: NEGATIVE
GLUCOSE UA: NEGATIVE
Ketones, UA: NEGATIVE
LEUKOCYTES UA: NEGATIVE
NITRITE UA: NEGATIVE
Protein, UA: NEGATIVE
Spec Grav, UA: 1.01
Urobilinogen, UA: 0.2
pH, UA: 5

## 2014-05-02 LAB — IFOBT (OCCULT BLOOD): IMMUNOLOGICAL FECAL OCCULT BLOOD TEST: NEGATIVE

## 2014-05-02 LAB — POCT UA - MICROSCOPIC ONLY
Bacteria, U Microscopic: NEGATIVE
Casts, Ur, LPF, POC: NEGATIVE
Crystals, Ur, HPF, POC: NEGATIVE
Mucus, UA: NEGATIVE
WBC, UR, HPF, POC: NEGATIVE
YEAST UA: NEGATIVE

## 2014-05-02 MED ORDER — METFORMIN HCL 500 MG PO TABS: ORAL_TABLET | ORAL | Status: DC

## 2014-05-02 MED ORDER — GABAPENTIN 100 MG PO CAPS: ORAL_CAPSULE | ORAL | Status: DC

## 2014-05-02 MED ORDER — GABAPENTIN 100 MG PO CAPS: 200.0000 mg | ORAL_CAPSULE | Freq: Three times a day (TID) | ORAL | Status: DC

## 2014-05-02 NOTE — Patient Instructions (Signed)
Continue taking the metformin twice daily for diabetes  Continue taking the vitamin K for your blood  Continue taking the aspirin one daily  Take the Neurontin... (gabapentin) 100 mg 2 pills twice daily. If that does not help the pain take 2 pills 3 times daily.  Return in 6 months, sooner if needed  Continue walking  If you're having a lot of back pain you can take over-the-counter Aleve (naproxen) 220 mg twice daily with food only when needed

## 2014-05-02 NOTE — Progress Notes (Signed)
Physical exam:  History: Patient is here for physical examination. It is been a couple years since I've seen him. He lives in [place], but wanted to see me. His son brought him up here. He still goes to the oncologist here. He has done fairly well living in [place]. 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 He also could not give his medicines refilled because it had been too long.  Past medical history: Medical illnesses: History of myeloid dysplasia and chronic leukocytosis, stable. History of type 2 diabetes History of for for neuropathy Surgeries: None reported Allergies: None Medications: See list  Family history: Unremarkable Social history: He is of [place] origin, does not speak much [place]. Lives with his son in [place]. His son is a businessman has to travel a good deal. That is why they no longer live [place].  Review of systems: Constitutional: Unremarkable Respiratory: Unremarkable Cardiovascular: Unremarkable HEENT: Unremarkable except for bad dentition. We talked about that. Gastrointestinal: Burps a lot. Gets constipated easily. Urinary: Has 2-3 time nocturia. No complaints.  Musculoskeletal: Unremarkable except for the feet hurt.  Neurologic: The same Dermatologic: Unremarkable Psychiatric: Unremarkable Hard to test for his memory. It is good however apparently.  Physical examination: Elderly gentleman well-known to me. TMs has an old perforation on the right. His throat is clear. Neck supple without nodes. Teeth very bad, numerous stubs of teeth in a few bad remaining teeth. Eyes PERRLA. Difficult to see fundi. Chest clear. Heart regular without murmurs. And soft the mass or tenderness. Normal external genitalia. Testes atrophic. No hernias. Extremities unremarkable. Skin unremarkable. Back has some pain to the right of the spine in the lower lumbar region. Not  particularly tender. Fair range of motion.  Assessment: Physical examination for Medicare screening Low back pain Diabetes Diabetic neuropathy Myelodysplasia Leukocytosis Right TM perforation, old Foot pain Nocturia  Plan: See orders for additional labs We'll need to refill his medication  Results for orders placed in visit on 05/02/14  POCT CBC      Result Value Ref Range   WBC 18.0 (*) 4.6 - 10.2 K/uL   Lymph, poc 2.0  0.6 - 3.4   POC LYMPH PERCENT 11.2  10 - 50 %L   MID (cbc) 0.8  0 - 0.9   POC MID % 4.3  0 - 12 %M   POC Granulocyte 15.2 (*) 2 - 6.9   Granulocyte percent 84.5 (*) 37 - 80 %G   RBC 4.70  4.69 - 6.13 M/uL   Hemoglobin 13.1 (*) 14.1 - 18.1 g/dL   HCT, POC 41.8 (*) 43.5 - 53.7 %   MCV 88.9  80 - 97 fL   MCH, POC 27.9  27 - 31.2 pg   MCHC 31.3 (*) 31.8 - 35.4 g/dL   RDW, POC 15.2     Platelet Count, POC 697 (*) 142 - 424 K/uL   MPV 10.7  0 - 99.8 fL  IFOBT (OCCULT BLOOD)      Result Value Ref Range   IFOBT Negative    POCT UA - MICROSCOPIC ONLY      Result Value Ref Range   WBC, Ur, HPF, POC neg     RBC, urine, microscopic 2-6     Bacteria, U Microscopic neg     Mucus, UA neg     Epithelial cells, urine per micros 0-2     Crystals, Ur,  HPF, POC neg     Casts, Ur, LPF, POC neg     Yeast, UA neg    POCT URINALYSIS DIPSTICK      Result Value Ref Range   Color, UA yellow     Clarity, UA clear     Glucose, UA neg     Bilirubin, UA neg     Ketones, UA neg     Spec Grav, UA 1.010     Blood, UA moderate     pH, UA 5.0     Protein, UA neg     Urobilinogen, UA 0.2     Nitrite, UA neg     Leukocytes, UA Negative

## 2014-05-03 ENCOUNTER — Encounter: Payer: Self-pay | Admitting: *Deleted

## 2014-05-03 LAB — COMPREHENSIVE METABOLIC PANEL
ALBUMIN: 4.9 g/dL (ref 3.5–5.2)
ALT: <8 U/L (ref 0–53)
AST: 13 U/L (ref 0–37)
Alkaline Phosphatase: 66 U/L (ref 39–117)
BUN: 12 mg/dL (ref 6–23)
CALCIUM: 9.7 mg/dL (ref 8.4–10.5)
CHLORIDE: 102 meq/L (ref 96–112)
CO2: 28 mEq/L (ref 19–32)
Creat: 0.82 mg/dL (ref 0.50–1.35)
Glucose, Bld: 140 mg/dL — ABNORMAL HIGH (ref 70–99)
POTASSIUM: 5.1 meq/L (ref 3.5–5.3)
Sodium: 140 mEq/L (ref 135–145)
Total Bilirubin: 0.5 mg/dL (ref 0.2–1.2)
Total Protein: 7.3 g/dL (ref 6.0–8.3)

## 2014-07-02 ENCOUNTER — Telehealth: Payer: Self-pay | Admitting: *Deleted

## 2014-07-02 NOTE — Telephone Encounter (Signed)
Called and left an voicemail with [name] regarding making an appointment to be seen for an 50-month diabetes check up

## 2014-08-20 ENCOUNTER — Telehealth: Payer: Self-pay | Admitting: Oncology

## 2014-08-20 NOTE — Progress Notes (Signed)
Per OV in June 2015..the patient is to return in six months (December 2015)  for Diabetic Care follow up.

## 2014-08-20 NOTE — Progress Notes (Signed)
   Subjective:    Patient ID: [name], male    DOB: 1941-09-12, 73 y.o.   MRN: [id number]  HPI    Review of Systems     Objective:   Physical Exam        Assessment & Plan:

## 2014-08-20 NOTE — Telephone Encounter (Signed)
Pt called back to r/s from 10/06 due to out of town and is only in town on Wed/Thur. Pt confirmed labs/ov .Marland Kitchen... KJ

## 2014-08-23 ENCOUNTER — Ambulatory Visit: Payer: Medicare Other | Admitting: Oncology

## 2014-08-23 ENCOUNTER — Other Ambulatory Visit: Payer: Medicare HMO

## 2014-09-12 ENCOUNTER — Ambulatory Visit (HOSPITAL_BASED_OUTPATIENT_CLINIC_OR_DEPARTMENT_OTHER): Payer: Medicare HMO | Admitting: Oncology

## 2014-09-12 ENCOUNTER — Telehealth: Payer: Self-pay | Admitting: Oncology

## 2014-09-12 ENCOUNTER — Other Ambulatory Visit: Payer: Medicare HMO

## 2014-09-12 VITALS — BP 141/71 | HR 79 | Temp 98.3°F | Resp 18 | Wt 127.1 lb

## 2014-09-12 DIAGNOSIS — E1140 Type 2 diabetes mellitus with diabetic neuropathy, unspecified: Secondary | ICD-10-CM

## 2014-09-12 DIAGNOSIS — D72829 Elevated white blood cell count, unspecified: Secondary | ICD-10-CM

## 2014-09-12 DIAGNOSIS — D649 Anemia, unspecified: Secondary | ICD-10-CM

## 2014-09-12 DIAGNOSIS — D759 Disease of blood and blood-forming organs, unspecified: Secondary | ICD-10-CM

## 2014-09-12 DIAGNOSIS — D473 Essential (hemorrhagic) thrombocythemia: Secondary | ICD-10-CM

## 2014-09-12 LAB — CBC WITH DIFFERENTIAL/PLATELET
BASO%: 0.7 % (ref 0.0–2.0)
Basophils Absolute: 0.1 10*3/uL (ref 0.0–0.1)
EOS%: 1.4 % (ref 0.0–7.0)
Eosinophils Absolute: 0.2 10*3/uL (ref 0.0–0.5)
HEMATOCRIT: 36.8 % — AB (ref 38.4–49.9)
HGB: 11.7 g/dL — ABNORMAL LOW (ref 13.0–17.1)
LYMPH#: 0.8 10*3/uL — AB (ref 0.9–3.3)
LYMPH%: 5.2 % — ABNORMAL LOW (ref 14.0–49.0)
MCH: 27.8 pg (ref 27.2–33.4)
MCHC: 31.8 g/dL — ABNORMAL LOW (ref 32.0–36.0)
MCV: 87.4 fL (ref 79.3–98.0)
MONO#: 0.8 10*3/uL (ref 0.1–0.9)
MONO%: 4.8 % (ref 0.0–14.0)
NEUT#: 14.1 10*3/uL — ABNORMAL HIGH (ref 1.5–6.5)
NEUT%: 87.9 % — ABNORMAL HIGH (ref 39.0–75.0)
Platelets: 574 10*3/uL — ABNORMAL HIGH (ref 140–400)
RBC: 4.21 10*6/uL (ref 4.20–5.82)
RDW: 14.3 % (ref 11.0–14.6)
WBC: 16 10*3/uL — ABNORMAL HIGH (ref 4.0–10.3)
nRBC: 0 % (ref 0–0)

## 2014-09-12 LAB — COMPREHENSIVE METABOLIC PANEL (CC13)
ALBUMIN: 4.2 g/dL (ref 3.5–5.0)
ALK PHOS: 59 U/L (ref 40–150)
ALT: 8 U/L (ref 0–55)
AST: 14 U/L (ref 5–34)
Anion Gap: 7 mEq/L (ref 3–11)
BILIRUBIN TOTAL: 0.55 mg/dL (ref 0.20–1.20)
BUN: 14.1 mg/dL (ref 7.0–26.0)
CO2: 28 mEq/L (ref 22–29)
Calcium: 9.1 mg/dL (ref 8.4–10.4)
Chloride: 109 mEq/L (ref 98–109)
Creatinine: 0.9 mg/dL (ref 0.7–1.3)
Glucose: 125 mg/dl (ref 70–140)
POTASSIUM: 4 meq/L (ref 3.5–5.1)
Sodium: 144 mEq/L (ref 136–145)
Total Protein: 6.5 g/dL (ref 6.4–8.3)

## 2014-09-12 NOTE — Progress Notes (Signed)
Hematology and Oncology Follow Up Visit  [name] [id number] 1941/09/02 73 y.o. 09/12/2014 1:14 PM    CC: Nicholas Boyer, MD    Principle Diagnosis: A 73 year old gentleman with leukocytosis and thrombocytosis, first evaluated in April 2012, etiology including a myeloproliferative disorder versus reactive process. He presented with a white cell count of 19,000, hemoglobin 14, platelet count was 841.  His workup showed a JAK2 mutations have been positive, indicating evidence against a myeloproliferative disorder. His BCR-Abl was negative.    Current therapy: Observation and surveillance.  Interim History:  [name] presents today for a followup visit with his son who is providing translation. Since the last visit, he continues to be asymptomatic at this point.  He is not reporting any chest pain, does not report any difficulty breathing, is not reporting any major changes in his performance status, is not reporting any change in his appetite or development of constitutional symptoms.  He had not reported any headaches or blurry vision, did not report any recent hospitalization or illnesses. He report tingling sensation in his lower extremities likely resembling neuropathy. No thrombosis or bleeding since last visit. He has not reported any hospitalizations or infections. Continue be in a rather excellent health and shape at this time. Rest of his review of systems unremarkable.  Medications: I have reviewed the patient's current medications. Current Outpatient Prescriptions  Medication Sig Dispense Refill  . aspirin 81 MG tablet Take 81 mg by mouth daily.        Marland Kitchen gabapentin (NEURONTIN) 100 MG capsule Take 2 tablets 2 times or 3 times daily for foot pain  540 capsule  3  . metFORMIN (GLUCOPHAGE) 500 MG tablet Take one twice daily for diabetes  180 tablet  3  . Multiple Vitamin (MULTIVITAMIN) tablet Take 1 tablet by mouth daily.        . vitamin k 100 MCG tablet Take 100 mcg by mouth  daily.         No current facility-administered medications for this visit.    Allergies: No Known Allergies  Past Medical History, Surgical history, Social history, and Family History were reviewed and updated.   Physical Exam: Blood pressure 141/71, pulse 79, temperature 98.3 F (36.8 C), temperature source Oral, resp. rate 18, weight 127 lb 1 oz (57.635 kg). ECOG: 1 General appearance: alert awake appeared in no active distress. Head: Normocephalic, without obvious abnormality, atraumatic Neck: no adenopathy Lymph nodes: Cervical, supraclavicular, and axillary nodes normal. Heart:regular rate and rhythm, S1, S2 normal, no murmur, click, rub or gallop Lung:chest clear, no wheezing, rales, normal symmetric air entry Abdomen: soft, nondistended, normal bowel sounds and splenic tip palpable EXT:no erythema, induration, or nodules   Lab Results: Lab Results  Component Value Date   WBC 16.0* 09/12/2014   HGB 11.7* 09/12/2014   HCT 36.8* 09/12/2014   MCV 87.4 09/12/2014   PLT 574* 09/12/2014     Chemistry      Component Value Date/Time   NA 144 09/12/2014 1217   NA 140 05/02/2014 1033   K 4.0 09/12/2014 1217   K 5.1 05/02/2014 1033   CL 102 05/02/2014 1033   CL 105 02/07/2013 0942   CO2 28 09/12/2014 1217   CO2 28 05/02/2014 1033   BUN 14.1 09/12/2014 1217   BUN 12 05/02/2014 1033   CREATININE 0.9 09/12/2014 1217   CREATININE 0.82 05/02/2014 1033   CREATININE 0.67 08/10/2011 0600      Component Value Date/Time   CALCIUM 9.1  09/12/2014 1217   CALCIUM 9.7 05/02/2014 1033   ALKPHOS 59 09/12/2014 1217   ALKPHOS 66 05/02/2014 1033   AST 14 09/12/2014 1217   AST 13 05/02/2014 1033   ALT 8 09/12/2014 1217   ALT <8 05/02/2014 1033   BILITOT 0.55 09/12/2014 1217   BILITOT 0.5 05/02/2014 1033       Impression and Plan:  This is a pleasant 73 year old gentleman with the following issues:   1. Leukocytosis and thrombocytosis, differential diagnosis including a  myeloproliferative disorder. 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  At this point, given his platelet counts  are relatively stable and he is currently on aspirin. I do not see any need to start a platelet lowering agent I will continue to monitor him closely. We will consider that if his blood counts close to 800,000. 2. Anemia.  This is relatively mild with hgb of 11.7.  3. Diabetic neuropathy: managed by his PCP.  4. Follow-up. In 6 months    Sierah Lacewell 10/29/20151:14 PM

## 2014-09-12 NOTE — Telephone Encounter (Signed)
Gave avs & cal for April 2016. °

## 2014-10-22 ENCOUNTER — Telehealth: Payer: Self-pay | Admitting: Family Medicine

## 2014-10-22 NOTE — Telephone Encounter (Signed)
Patient stated that he would be coming in before Christmas to get a flu shot.

## 2014-12-25 ENCOUNTER — Telehealth: Payer: Self-pay

## 2014-12-25 NOTE — Telephone Encounter (Signed)
Pt's son calling to speak with someone about his father's inability to use the bathroom. Please advise. CB # [phone]

## 2014-12-27 NOTE — Telephone Encounter (Signed)
Spoke with son, he states his father is having trouble making bowel movements and it has been 2 days since. He states this happens a lot and wanted to see if we could send in something to help him go. He states he received a medication last time (liquid) which he had to pick up from the pharmacy. Please advise.

## 2014-12-28 NOTE — Telephone Encounter (Signed)
I would have them use Colace to soften the stool and Miralax that is OTC to help give him a BM.

## 2014-12-29 NOTE — Telephone Encounter (Signed)
Pt's son notified

## 2015-01-22 ENCOUNTER — Telehealth: Payer: Self-pay | Admitting: Oncology

## 2015-01-22 NOTE — Telephone Encounter (Signed)
Lft msg for pt confirming labs/ov r/s per provider schedule, mailed schedule to pt ..... KJ  °

## 2015-03-13 ENCOUNTER — Ambulatory Visit: Payer: Medicare HMO | Admitting: Oncology

## 2015-03-13 ENCOUNTER — Other Ambulatory Visit: Payer: Medicare HMO

## 2015-03-18 ENCOUNTER — Telehealth: Payer: Self-pay | Admitting: Oncology

## 2015-03-18 ENCOUNTER — Other Ambulatory Visit (HOSPITAL_BASED_OUTPATIENT_CLINIC_OR_DEPARTMENT_OTHER): Payer: Medicare HMO

## 2015-03-18 ENCOUNTER — Ambulatory Visit (HOSPITAL_BASED_OUTPATIENT_CLINIC_OR_DEPARTMENT_OTHER): Payer: Medicare HMO | Admitting: Oncology

## 2015-03-18 VITALS — BP 145/62 | HR 67 | Temp 98.3°F | Resp 18 | Ht 66.0 in | Wt 126.5 lb

## 2015-03-18 DIAGNOSIS — D72829 Elevated white blood cell count, unspecified: Secondary | ICD-10-CM

## 2015-03-18 DIAGNOSIS — D473 Essential (hemorrhagic) thrombocythemia: Secondary | ICD-10-CM

## 2015-03-18 DIAGNOSIS — D649 Anemia, unspecified: Secondary | ICD-10-CM | POA: Diagnosis not present

## 2015-03-18 DIAGNOSIS — D759 Disease of blood and blood-forming organs, unspecified: Secondary | ICD-10-CM

## 2015-03-18 LAB — CBC WITH DIFFERENTIAL/PLATELET
BASO%: 0.9 % (ref 0.0–2.0)
Basophils Absolute: 0.2 10*3/uL — ABNORMAL HIGH (ref 0.0–0.1)
EOS%: 1.5 % (ref 0.0–7.0)
Eosinophils Absolute: 0.3 10*3/uL (ref 0.0–0.5)
HEMATOCRIT: 38 % — AB (ref 38.4–49.9)
HGB: 12.1 g/dL — ABNORMAL LOW (ref 13.0–17.1)
LYMPH#: 0.6 10*3/uL — AB (ref 0.9–3.3)
LYMPH%: 3 % — ABNORMAL LOW (ref 14.0–49.0)
MCH: 27.9 pg (ref 27.2–33.4)
MCHC: 31.9 g/dL — AB (ref 32.0–36.0)
MCV: 87.6 fL (ref 79.3–98.0)
MONO#: 0.7 10*3/uL (ref 0.1–0.9)
MONO%: 3.9 % (ref 0.0–14.0)
NEUT#: 17.6 10*3/uL — ABNORMAL HIGH (ref 1.5–6.5)
NEUT%: 90.7 % — AB (ref 39.0–75.0)
PLATELETS: 631 10*3/uL — AB (ref 140–400)
RBC: 4.33 10*6/uL (ref 4.20–5.82)
RDW: 15.3 % — ABNORMAL HIGH (ref 11.0–14.6)
WBC: 19.4 10*3/uL — ABNORMAL HIGH (ref 4.0–10.3)

## 2015-03-18 LAB — COMPREHENSIVE METABOLIC PANEL (CC13)
ALT: 7 U/L (ref 0–55)
AST: 12 U/L (ref 5–34)
Albumin: 4.5 g/dL (ref 3.5–5.0)
Alkaline Phosphatase: 66 U/L (ref 40–150)
Anion Gap: 9 mEq/L (ref 3–11)
BILIRUBIN TOTAL: 0.65 mg/dL (ref 0.20–1.20)
BUN: 16.4 mg/dL (ref 7.0–26.0)
CO2: 27 mEq/L (ref 22–29)
Calcium: 9.1 mg/dL (ref 8.4–10.4)
Chloride: 107 mEq/L (ref 98–109)
Creatinine: 0.9 mg/dL (ref 0.7–1.3)
EGFR: 79 mL/min/{1.73_m2} — AB (ref >90)
GLUCOSE: 141 mg/dL — AB (ref 70–140)
Potassium: 4.1 mEq/L (ref 3.5–5.1)
Sodium: 144 mEq/L (ref 136–145)
Total Protein: 6.9 g/dL (ref 6.4–8.3)

## 2015-03-18 NOTE — Progress Notes (Signed)
Hematology and Oncology Follow Up Visit  [name] [id number] 12-11-1940 74 y.o. 03/18/2015 10:10 AM    CC: [name], MD    Principle Diagnosis: A 74 year old gentleman with leukocytosis and thrombocytosis, first evaluated in April 2012, etiology including a myeloproliferative disorder versus reactive process. He presented with a white cell count of 19,000, hemoglobin 14, platelet count was 841.  His workup showed a JAK2 mutations have been positive, indicating evidence against a myeloproliferative disorder. His BCR-Abl was negative.    Current therapy: Observation and surveillance.  Interim History:  Mr. Lea presents today for a followup visit with an interpreter. Since the last visit, he reports mild issues of constipation. Otherwise he is asymptomatic.  He is not reporting any chest pain, does not report any difficulty breathing, is not reporting any major changes in his performance status, is not reporting any change in his appetite or development of constitutional symptoms.  He had not reported any headaches or blurry vision, did not report any recent hospitalization or illnesses. He report tingling sensation in his lower extremities likely resembling neuropathy. No thrombosis or bleeding since last visit. He has not reported any hospitalizations or infections. Rest of his review of systems unremarkable.  Medications: I have reviewed the patient's current medications. Current Outpatient Prescriptions  Medication Sig Dispense Refill  . aspirin 81 MG tablet Take 81 mg by mouth daily.      Marland Kitchen gabapentin (NEURONTIN) 100 MG capsule Take 2 tablets 2 times or 3 times daily for foot pain 540 capsule 3  . metFORMIN (GLUCOPHAGE) 500 MG tablet Take one twice daily for diabetes 180 tablet 3  . Multiple Vitamin (MULTIVITAMIN) tablet Take 1 tablet by mouth daily.      . vitamin k 100 MCG tablet Take 100 mcg by mouth daily.       No current facility-administered medications for this visit.     Allergies: No Known Allergies  Past Medical History, Surgical history, Social history, and Family History were reviewed and updated.   Physical Exam: Blood pressure 145/62, pulse 67, temperature 98.3 F (36.8 C), temperature source Oral, resp. rate 18, height 5\' 6"  (1.676 m), weight 126 lb 8 oz (57.38 kg), SpO2 100 %. ECOG: 1 General appearance: alert awake well-appearing gentleman. Head: Normocephalic, without obvious abnormality Neck: no adenopathy Lymph nodes: Cervical, supraclavicular, and axillary nodes normal. Heart:regular rate and rhythm, S1, S2 normal, no murmur, click, rub or gallop Lung:chest clear, no wheezing, rales, normal symmetric air entry Abdomen: soft, nondistended, normal bowel sounds and splenic tip palpable EXT:no erythema, induration, or nodules   Lab Results: Lab Results  Component Value Date   WBC 19.4* 03/18/2015   HGB 12.1* 03/18/2015   HCT 38.0* 03/18/2015   MCV 87.6 03/18/2015   PLT 631* 03/18/2015     Chemistry      Component Value Date/Time   NA 144 09/12/2014 1217   NA 140 05/02/2014 1033   K 4.0 09/12/2014 1217   K 5.1 05/02/2014 1033   CL 102 05/02/2014 1033   CL 105 02/07/2013 0942   CO2 28 09/12/2014 1217   CO2 28 05/02/2014 1033   BUN 14.1 09/12/2014 1217   BUN 12 05/02/2014 1033   CREATININE 0.9 09/12/2014 1217   CREATININE 0.82 05/02/2014 1033   CREATININE 0.67 08/10/2011 0600      Component Value Date/Time   CALCIUM 9.1 09/12/2014 1217   CALCIUM 9.7 05/02/2014 1033   ALKPHOS 59 09/12/2014 1217   ALKPHOS 66 05/02/2014 1033  AST 14 09/12/2014 1217   AST 13 05/02/2014 1033   ALT 8 09/12/2014 1217   ALT <8 05/02/2014 1033   BILITOT 0.55 09/12/2014 1217   BILITOT 0.5 05/02/2014 1033       Impression and Plan:  This is a pleasant 74 year old gentleman with the following issues:   1. Leukocytosis and thrombocytosis, differential diagnosis including a myeloproliferative disorder. 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  At this point, given his platelet counts  are relatively stable and he is currently on aspirin. I do not see any need to start a platelet lowering agent I will continue to monitor him and would consider treating him with hydroxyurea if his platelet count is above 800. 2. Anemia.  This is relatively mild with hgb of 12.1.  3. Diabetic neuropathy: managed by his PCP.  4. Follow-up. In 6 months    AESLPN,PYYFR 5/3/201610:10 AM

## 2015-03-18 NOTE — Telephone Encounter (Signed)
per pof to sch pt appt-gave pt copy of sch °

## 2015-04-12 ENCOUNTER — Ambulatory Visit (INDEPENDENT_AMBULATORY_CARE_PROVIDER_SITE_OTHER): Payer: Medicare HMO | Admitting: Family Medicine

## 2015-04-12 VITALS — BP 110/70 | HR 62 | Temp 97.6°F | Ht 65.0 in | Wt 122.5 lb

## 2015-04-12 DIAGNOSIS — D473 Essential (hemorrhagic) thrombocythemia: Secondary | ICD-10-CM | POA: Diagnosis not present

## 2015-04-12 DIAGNOSIS — D75839 Thrombocytosis, unspecified: Secondary | ICD-10-CM

## 2015-04-12 DIAGNOSIS — K5901 Slow transit constipation: Secondary | ICD-10-CM | POA: Diagnosis not present

## 2015-04-12 DIAGNOSIS — E119 Type 2 diabetes mellitus without complications: Secondary | ICD-10-CM | POA: Diagnosis not present

## 2015-04-12 DIAGNOSIS — Z860100 Personal history of colon polyps, unspecified: Secondary | ICD-10-CM

## 2015-04-12 DIAGNOSIS — D72829 Elevated white blood cell count, unspecified: Secondary | ICD-10-CM

## 2015-04-12 DIAGNOSIS — R42 Dizziness and giddiness: Secondary | ICD-10-CM | POA: Diagnosis not present

## 2015-04-12 LAB — POCT CBC
GRANULOCYTE PERCENT: 92.8 % — AB (ref 37–80)
HEMATOCRIT: 41.4 % — AB (ref 43.5–53.7)
HEMOGLOBIN: 13.6 g/dL — AB (ref 14.1–18.1)
Lymph, poc: 1.4 (ref 0.6–3.4)
MCH: 28.5 pg (ref 27–31.2)
MCHC: 32.9 g/dL (ref 31.8–35.4)
MCV: 86.8 fL (ref 80–97)
MID (cbc): 0.2 (ref 0–0.9)
MPV: 8.6 fL (ref 0–99.8)
PLATELET COUNT, POC: 705 10*3/uL — AB (ref 142–424)
POC GRANULOCYTE: 20 — AB (ref 2–6.9)
POC LYMPH %: 6.5 % — AB (ref 10–50)
POC MID %: 0.7 %M (ref 0–12)
RBC: 4.77 M/uL (ref 4.69–6.13)
RDW, POC: 16.2 %
WBC: 21.6 10*3/uL — AB (ref 4.6–10.2)

## 2015-04-12 LAB — COMPLETE METABOLIC PANEL WITH GFR
ALT: <8 U/L (ref 0–53)
AST: 14 U/L (ref 0–37)
Albumin: 5.2 g/dL (ref 3.5–5.2)
Alkaline Phosphatase: 58 U/L (ref 39–117)
BUN: 17 mg/dL (ref 6–23)
CO2: 29 mEq/L (ref 19–32)
Calcium: 9.8 mg/dL (ref 8.4–10.5)
Chloride: 102 mEq/L (ref 96–112)
Creat: 0.97 mg/dL (ref 0.50–1.35)
GFR, Est African American: 89 mL/min
GFR, Est Non African American: 77 mL/min
Glucose, Bld: 144 mg/dL — ABNORMAL HIGH (ref 70–99)
POTASSIUM: 4.8 meq/L (ref 3.5–5.3)
Sodium: 139 mEq/L (ref 135–145)
TOTAL PROTEIN: 7.5 g/dL (ref 6.0–8.3)
Total Bilirubin: 0.6 mg/dL (ref 0.2–1.2)

## 2015-04-12 LAB — POCT URINALYSIS DIPSTICK
Bilirubin, UA: NEGATIVE
GLUCOSE UA: NEGATIVE
KETONES UA: NEGATIVE
LEUKOCYTES UA: NEGATIVE
NITRITE UA: NEGATIVE
SPEC GRAV UA: 1.01
Urobilinogen, UA: 0.2
pH, UA: 7

## 2015-04-12 LAB — LIPID PANEL
CHOL/HDL RATIO: 3.2 ratio
Cholesterol: 70 mg/dL (ref 0–200)
HDL: 22 mg/dL — ABNORMAL LOW (ref >=40)
LDL Cholesterol: 30 mg/dL (ref 0–99)
TRIGLYCERIDES: 91 mg/dL (ref <150)
VLDL: 18 mg/dL (ref 0–40)

## 2015-04-12 LAB — MICROALBUMIN, URINE: Microalb, Ur: 4.8 mg/dL — ABNORMAL HIGH (ref <2.0)

## 2015-04-12 LAB — TSH: TSH: 3.187 u[IU]/mL (ref 0.350–4.500)

## 2015-04-12 LAB — POCT GLYCOSYLATED HEMOGLOBIN (HGB A1C): HEMOGLOBIN A1C: 6.6

## 2015-04-12 NOTE — Patient Instructions (Addendum)
Take MiraLAX one dose daily for the constipation. It is a powder that he mixes in a glass of water and drinks. If his stools get too loose, he can decrease to taking it every 2 or 3 days. This is a safe medicine for long-term use if needed for the bowels.  Continue other medications in the same fashion  Drink plenty of fluids  We will try and get an appointment for him with an eye doctor in the [place] area  I will let you know the results of the additional labs

## 2015-04-12 NOTE — Progress Notes (Signed)
Subjective:  Patient ID: [name], male    DOB: April 22, 1941  Age: 74 y.o. MRN: [id number]  74 year old [place] gentleman who now lives in [place] but prefers to come back here for his medical care. He has been having a long history of constipation. 3 or 4 years ago when he had his colonoscopy he had a single polyp which was removed. He is due to get that rechecked in a year or so. He has otherwise been doing fairly well. Doesn't walk as well as he used to. He gets a little dizziness and staggering his gait. No real headache. No chest pain or shortness of breath. No GI or GU complaints other than the constipation. No musculoskeletal, dermatologic, or other neurologic issues in the dizziness. HEENT is normal but he has not seen an eye doctor for several years. He would like to see one in the [place] area. He does not check his sugars. Bowels move, but are hard and sluggish. He has seen the rheumatologist recently for his chronic blood dyscrasia   Objective:   Alert and oriented. Neck supple without nodes or thyromegaly. Chest is clear to auscultation. TMs normal. Throat clear. Eyes PERRLA. Heart regular without murmurs gallops or arrhythmias. And soft without mass or tenderness except for possible spleen tip palpable. No ankle edema. Skin unremarkable.  Results for orders placed or performed in visit on 04/12/15  POCT CBC  Result Value Ref Range   WBC 21.6 (A) 4.6 - 10.2 K/uL   Lymph, poc 1.4 0.6 - 3.4   POC LYMPH PERCENT 6.5 (A) 10 - 50 %L   MID (cbc) 0.2 0 - 0.9   POC MID % 0.7 0 - 12 %M   POC Granulocyte 20.0 (A) 2 - 6.9   Granulocyte percent 92.8 (A) 37 - 80 %G   RBC 4.77 4.69 - 6.13 M/uL   Hemoglobin 13.6 (A) 14.1 - 18.1 g/dL   HCT, POC 41.4 (A) 43.5 - 53.7 %   MCV 86.8 80 - 97 fL   MCH, POC 28.5 27 - 31.2 pg   MCHC 32.9 31.8 - 35.4 g/dL   RDW, POC 16.2 %   Platelet Count, POC 705 (A) 142 - 424 K/uL   MPV 8.6 0 - 99.8 fL  POCT glycosylated hemoglobin (Hb A1C)  Result Value  Ref Range   Hemoglobin A1C 6.6   POCT urinalysis dipstick  Result Value Ref Range   Color, UA yellow    Clarity, UA clear    Glucose, UA neg    Bilirubin, UA neg    Ketones, UA neg    Spec Grav, UA 1.010    Blood trace-intact    pH, UA 7.0    Protein, UA trace    Urobilinogen, UA 0.2    Nitrite, UA neg    Leukocytes, UA Negative      Assessment & Plan:   Assessment: Type 2 diabetes Chronic constipation Leukocytosis Thrombocytosis   Plan: Check his labs for his lipids, chemistries, A1c, CBC  Colonoscopy in 1-2 years  Referred to an outside doctor in the [place] area Patient Instructions  Take MiraLAX one dose daily for the constipation. It is a powder that he mixes in a glass of water and drinks. If his stools get too loose, he can decrease to taking it every 2 or 3 days. This is a safe medicine for long-term use if needed for the bowels.  Continue other medications in the same fashion  Drink plenty of fluids  We will try and get an appointment for him with an eye doctor in the [place] area  I will let you know the results of the additional labs     Cody, MD 04/12/2015

## 2015-04-13 ENCOUNTER — Encounter: Payer: Self-pay | Admitting: Family Medicine

## 2015-07-16 ENCOUNTER — Other Ambulatory Visit: Payer: Self-pay | Admitting: Emergency Medicine

## 2015-08-28 ENCOUNTER — Telehealth: Payer: Self-pay

## 2015-08-28 ENCOUNTER — Encounter: Payer: Self-pay | Admitting: Radiology

## 2015-08-28 DIAGNOSIS — E119 Type 2 diabetes mellitus without complications: Secondary | ICD-10-CM

## 2015-08-28 MED ORDER — METFORMIN HCL 500 MG PO TABS: ORAL_TABLET | ORAL | Status: DC

## 2015-08-28 NOTE — Telephone Encounter (Signed)
Pt is aware he has 1 month refill and he will have to return if he needs more.

## 2015-08-28 NOTE — Telephone Encounter (Signed)
Gave 1 mos RF. LMOM that pt needs to RTC for more. Due for f/up.

## 2015-08-28 NOTE — Telephone Encounter (Signed)
Pt would like a refill on his . Please advise at [phone]  metFORMIN (GLUCOPHAGE) 500 MG tablet [[id number]]

## 2015-09-06 ENCOUNTER — Ambulatory Visit (INDEPENDENT_AMBULATORY_CARE_PROVIDER_SITE_OTHER): Payer: Medicare HMO | Admitting: Emergency Medicine

## 2015-09-06 VITALS — BP 132/62 | HR 61 | Temp 98.0°F | Resp 20 | Ht 66.0 in | Wt 125.0 lb

## 2015-09-06 DIAGNOSIS — E119 Type 2 diabetes mellitus without complications: Secondary | ICD-10-CM

## 2015-09-06 DIAGNOSIS — E1140 Type 2 diabetes mellitus with diabetic neuropathy, unspecified: Secondary | ICD-10-CM | POA: Diagnosis not present

## 2015-09-06 DIAGNOSIS — Z23 Encounter for immunization: Secondary | ICD-10-CM

## 2015-09-06 LAB — COMPREHENSIVE METABOLIC PANEL
ALT: 6 U/L — ABNORMAL LOW (ref 9–46)
AST: 11 U/L (ref 10–35)
Albumin: 4.6 g/dL (ref 3.6–5.1)
Alkaline Phosphatase: 68 U/L (ref 40–115)
BUN: 20 mg/dL (ref 7–25)
CALCIUM: 9.7 mg/dL (ref 8.6–10.3)
CO2: 27 mmol/L (ref 20–31)
Chloride: 102 mmol/L (ref 98–110)
Creat: 0.97 mg/dL (ref 0.70–1.18)
Glucose, Bld: 109 mg/dL — ABNORMAL HIGH (ref 65–99)
POTASSIUM: 4.7 mmol/L (ref 3.5–5.3)
Sodium: 140 mmol/L (ref 135–146)
Total Bilirubin: 1 mg/dL (ref 0.2–1.2)
Total Protein: 6.9 g/dL (ref 6.1–8.1)

## 2015-09-06 LAB — GLUCOSE, POCT (MANUAL RESULT ENTRY): POC Glucose: 154 mg/dl — AB (ref 70–99)

## 2015-09-06 LAB — LIPID PANEL
CHOL/HDL RATIO: 3 ratio (ref <=5.0)
Cholesterol: 59 mg/dL — ABNORMAL LOW (ref 125–200)
HDL: 20 mg/dL — AB (ref >=40)
LDL Cholesterol: 29 mg/dL (ref <130)
Triglycerides: 49 mg/dL (ref <150)
VLDL: 10 mg/dL (ref <30)

## 2015-09-06 LAB — POCT GLYCOSYLATED HEMOGLOBIN (HGB A1C): HEMOGLOBIN A1C: 6.7

## 2015-09-06 LAB — HEMOGLOBIN A1C: Hgb A1c MFr Bld: 6.7 % — AB (ref 4.0–6.0)

## 2015-09-06 MED ORDER — GABAPENTIN 100 MG PO CAPS: ORAL_CAPSULE | ORAL | Status: DC

## 2015-09-06 MED ORDER — METFORMIN HCL 500 MG PO TABS: 1000.0000 mg | ORAL_TABLET | Freq: Two times a day (BID) | ORAL | Status: DC

## 2015-09-06 NOTE — Progress Notes (Signed)
Subjective:  Patient ID: [name], male    DOB: Jul 12, 1941  Age: 74 y.o. MRN: [id number]  CC: Medication Refill and Flu Vaccine   HPI [name] presents  patient has a history of diabetes complicated by peripheral neuropathy for which he takes gabapentin. He is living in [place] and has no local doctor in [place] and comes here for medical care. He is brought here by his son as a Optometrist. He has no current complaint. He tolerates medication well and has no adverse side effect. His neuropathy is stable  History [name] has a past medical history of Leukocytosis; Thrombocytosis ([name]); Diabetes mellitus, type 2 ([name]); Anemia; and GERD (gastroesophageal reflux disease).   He has past surgical history that includes Hernia repair.   His  family history is negative for Colon cancer.  He   reports that he has never smoked. He has never used smokeless tobacco. He reports that he drinks alcohol. He reports that he does not use illicit drugs.  Outpatient Prescriptions Prior to Visit  Medication Sig Dispense Refill  . aspirin 81 MG tablet Take 81 mg by mouth daily.      . Multiple Vitamin (MULTIVITAMIN) tablet Take 1 tablet by mouth daily.      . vitamin k 100 MCG tablet Take 100 mcg by mouth daily.      Marland Kitchen gabapentin (NEURONTIN) 100 MG capsule Take 2 tablets 2 times or 3 times daily for foot pain 540 capsule 3  . metFORMIN (GLUCOPHAGE) 500 MG tablet Take one twice daily for diabetes. PATIENT NEEDS OFFICE VISIT FOR ADDITIONAL REFILLS 60 tablet 0   No facility-administered medications prior to visit.    Social History   Social History  . Marital Status: Single    Spouse Name: N/A  . Number of Children: 1  . Years of Education: N/A   Occupational History  . Retired    Social History Main Topics  . Smoking status: Never Smoker   . Smokeless tobacco: Never Used  . Alcohol Use: Yes     Comment: occ  . Drug Use: No  . Sexual Activity: Not Asked   Other Topics Concern  .  None   Social History Narrative   0 caffeine drinks daily      Review of Systems  Constitutional: Negative for fever, chills and appetite change.  HENT: Negative for congestion, ear pain, postnasal drip, sinus pressure and sore throat.   Eyes: Negative for pain and redness.  Respiratory: Negative for cough, shortness of breath and wheezing.   Cardiovascular: Negative for leg swelling.  Gastrointestinal: Negative for nausea, vomiting, abdominal pain, diarrhea, constipation and blood in stool.  Endocrine: Negative for polyuria.  Genitourinary: Negative for dysuria, urgency, frequency and flank pain.  Musculoskeletal: Negative for gait problem.  Skin: Negative for rash.  Neurological: Negative for weakness and headaches.  Psychiatric/Behavioral: Negative for confusion and decreased concentration. The patient is not nervous/anxious.     Objective:  BP 132/62 mmHg  Pulse 61  Temp(Src) 98 F (36.7 C) (Oral)  Resp 20  Ht 5\' 6"  (1.676 m)  Wt 125 lb (56.7 kg)  BMI 20.19 kg/m2  SpO2 98%  Physical Exam  Constitutional: He is oriented to person, place, and time. He appears well-developed and well-nourished. No distress.  HENT:  Head: Normocephalic and atraumatic.  Right Ear: External ear normal.  Left Ear: External ear normal.  Nose: Nose normal.  Eyes: Conjunctivae and EOM are normal. Pupils are equal, round, and reactive  to light. No scleral icterus.  Neck: Normal range of motion. Neck supple. No tracheal deviation present.  Cardiovascular: Normal rate, regular rhythm and normal heart sounds.   Pulmonary/Chest: Effort normal. No respiratory distress. He has no wheezes. He has no rales.  Abdominal: He exhibits no mass. There is no tenderness. There is no rebound and no guarding.  Musculoskeletal: He exhibits no edema.  Lymphadenopathy:    He has no cervical adenopathy.  Neurological: He is alert and oriented to person, place, and time. Coordination normal.  Skin: Skin is warm  and dry. No rash noted.  Psychiatric: He has a normal mood and affect. His behavior is normal.      Assessment & Plan:   [name] was seen today for medication refill and flu vaccine.  Diagnoses and all orders for this visit:  Type 2 diabetes, controlled, with neuropathy (HCC) -     POCT glycosylated hemoglobin (Hb A1C) -     POCT glucose (manual entry) -     Comprehensive metabolic panel -     Lipid panel -     metFORMIN (GLUCOPHAGE) 500 MG tablet; Take 2 tablets (1,000 mg total) by mouth 2 (two) times daily with a meal. -     gabapentin (NEURONTIN) 100 MG capsule; Take 2 tablets 2 times or 3 times daily for foot pain  Needs flu shot -     POCT glycosylated hemoglobin (Hb A1C) -     POCT glucose (manual entry) -     Comprehensive metabolic panel -     Lipid panel -     Flu Vaccine QUAD 36+ mos IM   I have changed [name] metFORMIN. I am also having him maintain his aspirin, multivitamin, vitamin k, and gabapentin.  Meds ordered this encounter  Medications  . metFORMIN (GLUCOPHAGE) 500 MG tablet    Sig: Take 2 tablets (1,000 mg total) by mouth 2 (two) times daily with a meal.    Dispense:  120 tablet    Refill:  5  . gabapentin (NEURONTIN) 100 MG capsule    Sig: Take 2 tablets 2 times or 3 times daily for foot pain    Dispense:  540 capsule    Refill:  3    Appropriate red flag conditions were discussed with the patient as well as actions that should be taken.  Patient expressed his understanding.  Follow-up: Return if symptoms worsen or fail to improve.  Roselee Culver, MD   Results for orders placed or performed in visit on 09/06/15  POCT glycosylated hemoglobin (Hb A1C)  Result Value Ref Range   Hemoglobin A1C 6.7   POCT glucose (manual entry)  Result Value Ref Range   POC Glucose 154 (A) 70 - 99 mg/dl

## 2015-09-06 NOTE — Patient Instructions (Signed)
B?nh ti?u ???ng tp 2, Ng??i l?n (Type 2 Diabetes Mellitus, Adult) B?nh ti?u ???ng tp 2, th??ng g?i ??n gi?n l ti?u ???ng tp 2, l m?t b?nh ko di (m?n tnh). Trong ti?u ???ng tp 2, tuy?n t?y khng s?n xu?t ?? insulin (hocmon), cc t? bo t ?p ?ng v?i insulin lm cho ( khng insulin), ho?c c? hai. Thng th??ng, insulin v?n chuy?n ???ng t? th?c ?n vo cc t? bo ? m. Cc t? bo ? m s? d?ng ???ng ?? s?n sinh ra n?ng l??ng. Thi?u h?t insulin ho?c khng ?p ?ng bnh th??ng v?i insulin gy ra l??ng ???ng d? th?a tch t? trong mu thay v ?i vo cc t? bo ? m. K?t qu? l, lm cho l??ng ???ng trong mu cao (t?ng ???ng huy?t). ?nh h??ng c?a hm l??ng ???ng (glucose) cao c th? gy ra nhi?u bi?n ch?ng.  B?nh ti?u ???ng tp 2 tr??c ?y cn ???c g?i l b?nh ti?u ???ng kh?i pht ? ng??i l?n, nh?ng n c th? x?y ra ? b?t c? l?a tu?i no.  CC Y?U T? NGUY C?  M?t ng??i d? b? b?nh ti?u ???ng tp 2 n?u c ai ? trong gia ?nh b? b?nh ny, ??ng th?i c m?t ho?c nhi?u y?u t? nguy c? chnh sau ?y:  T?ng cn ho?c th?a cn ho?c bo ph.  L?i s?ng t ho?t ??ng.  Ti?n s? lin t?c ?n th?c ?n nhi?u n?ng l??ng. Duy tr cn n?ng bnh th??ng v ho?t ??ng thn th? th??ng xuyn c th? lm gi?m nguy c? pht tri?n b?nh ti?u ???ng tp 2. TRI?U CH?NG  Ban ??u, m?t ng??i b? b?nh ti?u ???ng tp 2 c th? khng c cc tri?u ch?ng. Cc tri?u ch?ng c?a b?nh ti?u ???ng tp 2 xu?t hi?n t? t?. Cc tri?u ch?ng bao g?m:  Kht n??c nhi?u (ch?ng kht nhi?u).  Ti?u ti?n nhi?u (?a ni?u).  ?i ti?u nhi?u vo ban ?m (ti?u ?m).  Thay ??i cn n?ng m?t cch ??t ng?t ho?c khng r nguyn nhn.  Th??ng xuyn b? nhi?m trng ti pht.  M?t m?i (m?t)  Y?u.  Thay ??i th? l?c, ch?ng h?n nh? nhn m?.  Mi tri cy trong h?i th? c?a qu v?.  ?au b?ng.  Bu?n nn ho?c nn m?a.  V?t c?t ho?c v?t b?m tm lu lnh.  ?au bu?t ho?c t ? bn tay ho?c bn chn.  V?t th??ng h? trn da (lot). CH?N ?ON B?nh ti?u ???ng tp 2 th??ng  khng ???c ch?n ?on cho ??n khi xu?t hi?n cc bi?n ch?ng c?a b?nh ti?u ???ng. B?nh ti?u ???ng tp 2 ???c ch?n ?on khi xu?t hi?n cc tri?u ch?ng c?ng nh? bi?n ch?ng v khi l??ng ???ng huy?t t?ng. L??ng ???ng huy?t c th? ???c ki?m tra b?ng m?t ho?c nhi?u xt nghi?m mu sau ?y:  Xt nghi?m ???ng huy?t lc ?i. Qu v? s? khng ???c php ?n trong t nh?t l 8 ti?ng tr??c khi l?y m?u mu.  Xt nghi?m ???ng huy?t ng?u nhin. ???ng huy?t ???c xt nghi?m b?t k? lc no trong ngy, b?t k? qu v? ?n lc no.  Xt nghi?m ???ng huy?t A1c hemoglobin. Xt nghi?m A1c hemoglobin cung c?p thng tin v? vi?c ki?m sot ???ng huy?t trong 3 thng tr??c ?.  Xt nghi?m dung n?p glucose theo ???ng u?ng (OGTT). ???ng huy?t c?a qu v? ???c ?o sau khi qu v? ch?a ?n (nh?n ?n) trong 2 gi? v sau ? l sau khi qu v? u?ng ?? u?ng c ch?a glucose. ?  I?U TR?   Qu v? c th? c?n dng insulin ho?c thu?c tr? ti?u ???ng hng ngy ?? gi? cho l??ng ???ng huy?t trong ph?m vi mong mu?n.  N?u qu v? dng insulin, qu v? c th? c?n ?i?u ch?nh li?u thu?c ty thu?c vo l??ng carbohydrate m qu v? ?n trong m?i b?a ?n chnh ho?c b?a ?n nh?.  Thay ??i l?i s?ng ???c khuy?n ngh? l m?t ph?n trong bi?n php ?i?u tr? c?a qu v?. Nh?ng thay ??i ny c th? bao g?m:  Tun theo m?t ch? ?? ?n c nhn ha do m?t chuyn gia dinh d??ng ??a ra.  T?p th? d?c hng ngy. Chuyn gia ch?m [place] s?c kh?e s? ??t cc m?c tiu ?i?u tr? c nhn ha cho qu v? d?a theo tu?i, cc lo?i thu?c c?a qu v?, th?i gian qu v? ? b? ti?u t??ng, v b?t k? tnh tr?ng b?nh l no khc m qu v? c. Thng th??ng, m?c tiu ?i?u tr? l duy tr n?ng ?? glucose trong mu:  Tr??c khi ?n (tr??c b?a ?n): 80-130 mg/dL.  Sau khi ?n (sau b?a ?n): d??i 180 mg/dL.  A1c: th?p h?n 6,5-7%. H??NG D?N CH?M [place] T?I NH   L??ng A1c hemoglobin c?a qu v? ???c ki?m tra hai l?n m?i n?m.  Th?c hi?n vi?c theo di ???ng huy?t hng ngy theo ch? d?n c?a chuyn gia ch?m [place] s?c kh?e.  Theo  di [place] trong n??c ti?u khi qu v? b? b?nh v theo ch? d?n c?a chuyn gia ch?m [place] s?c kh?e.  S? d?ng thu?c tr? ti?u ???ng ho?c insulin theo ch? d?n c?a chuyn gia ch?m  s?c kh?e ?? duy tr l??ng ???ng huy?t trong ph?m vi mong mu?n.  Khng bao gi? ?? h?t thu?c tr? ti?u ???ng ho?c insulin. Thu?c c?n ph?i dng hng ngy.  N?u qu v? ?ang dng insulin, qu v? c th? ?i?u ch?nh l??ng insulin d?a vo l??ng carbohydrates qu v? ?n. Carbohydrate c th? lm t?ng l??ng ???ng huy?t nh?ng c?n ph?i bao g?m trong ch? ?? ?n u?ng c?a qu v?. Carbohydrate cung c?p vitamin, khong ch?t v ch?t x?, l m?t ph?n thi?t y?u c?a ch? ?? ?n u?ng c l?i cho s?c kh?e. Carbohydrate ???c tm th?y trong tri cy, rau, ng? c?c, cc s?n ph?m t? s?a, cc lo?i ??u v cc lo?i th?c ph?m c b? sung thm ???ng.  ?n th?c ?n c l?i cho s?c kh?e. Qu v? c?n h?n g?p m?t chuyn gia dinh d??ng c ??ng k hnh ngh? ?? gip qu v? ??a ra m?t k? ho?ch ?n u?ng ph h?p.  Gi?m cn n?u qu v? th?a cn.  Mang theo th? c?nh bo y t? ho?c ?eo ?? trang s?c c c?nh bo y t?.  Mang theo ?? ?n nh? ch?a 15 gam carbohydrate m?i lc ?? ?i?u tr? h? ???ng huy?t (h? ???ng huy?t). M?t s? v d? v? ?? ?n nh? ch?a 15 gam carbohydrate bao g?m:  Vin glucose, 3 ho?c 4.  Gel glucose, ?ng 15 gam.  Nho kh, 2 mu?ng (24 gam).  Th?ch hnh h?t ??u, 6.  Bnh quy hnh con gi?ng, 8.  N??c u?ng c ga thng th??ng, 4 aox? (120 ml)  K?o chp chp, 9.  Nh?n bi?t h? ???ng huy?t. H? ???ng huy?t x?y ra khi l??ng ???ng huy?t t? 70 mg/dL tr? xu?ng. Nguy c? h? ???ng huy?t gia t?ng khi nh?n ?n ho?c b? b?a, trong v sau khi t?p th? d?c c??ng ?? cao v trong [employer]  ng?. Cc tri?u ch?ng h? ???ng huy?t c th? bao g?m:  Run ho?c l?c.  Gi?m kh? n?ng t?p trung.  ?? m? hi.  Nh?p tim t?ng.  ?au ??u.  Kh mi?ng.  ?i.  D? b? kch thch.  Lo u.  Ng? khng yn.  Thay ??i l?i ni ho?c s? ph?i h?p.  B? l l?n.  ?i?u tr? h? ???ng huy?t k?p th?i. N?u qu v?  t?nh to v c th? nu?t m?t cch an ton, hy theo quy t?c 15:15:  Dng 15-20 gam glucose ho?c carbohydrate c tc d?ng nhanh. L?a ch?n tc ??ng nhanh bao g?m gel glucose, vin glucose ho?c 4 aox? (120 ml) n??c p tri cy, soda bnh th??ng ho?c s?a t bo.  Ki?m tra l??ng ???ng huy?t c?a qu v? 15 pht sau khi u?ng glucose.  Dng t? 15-20 gam glucose tr? ln n?u l??ng ???ng huy?t ???c ?o l?i v?n ? m?c 70 mg/dL tr? xu?ng.  ?n theo b?a ?n bnh th??ng ho?c ?? ?n nh? trong vng 1 ti?ng sau khi l??ng ???ng huy?t tr? l?i bnh th??ng.  Hy c?nh gic v?i c?m gic r?t kht v ?i ti?u ti?n nhi?u l?n h?n bnh th??ng v ?y l nh?ng d?u hi?u s?m c?a t?ng ???ng huy?t. Vi?c pht hi?n t?ng ???ng huy?t s?m cho php ?i?u tr? k?p th?i. ?i?u tr? t?ng ???ng huy?t theo ch? d?n c?a chuyn gia ch?m [place] s?c kh?e.  M?i tu?n tham gia vo t nh?t 150 pht ho?t ??ng thn th? v?i c??ng ?? trung bnh, phn b? trong t nh?t 3 ngy trong tu?n ho?c theo ch? d?n c?a chuyn gia ch?m [place] s?c kh?e. Ngoi ra, qu v? nn tham gia vo bi t?p c s?c c?n t nh?t 2 l?n m?t tu?n ho?c theo ch? d?n c?a chuyn gia ch?m [place] s?c kh?e. C? g?ng dnh khng qu 90 pht m?i l?n khng ho?t ??ng.  ?i?u ch?nh thu?c v l??ng th?c ?n khi c?n n?u qu v? b?t ??u m?t bi t?p ho?c m?t mn th? thao m?i.  Lm theo k? ho?ch trong ngy b? b?nh c?a qu v? b?t c? lc no m qu v? khng th? ?n ho?c u?ng nh? bnh th??ng.  Khng s? d?ng cc s?n ph?m thu?c l bao g?m thu?c l ht, thu?c l d?ng nhai ho?c thu?c l ?i?n t?. N?u qu v? c?n gip ?? ?? cai thu?c, hy h?i chuyn gia ch?m [place] s?c kh?e.  Gi?i h?n l??ng r??u qu v? u?ng khng qu 1 ly m?i ngy v?i ph? n? khng mang thai v 2 ly m?i ngy v?i nam gi?i. Qu v? ch? nn u?ng r??u khi ?n. Ni chuy?n v?i chuyn gia ch?m [place] s?c kh?e xem u?ng r??u c an ton cho qu v? hay khng. Cho chuyn gia ch?m [place] s?c kh?e bi?t n?u qu v? u?ng r??u vi l?n m?i tu?n.  Tun th? m?i cu?c h?n khm l?i theo ch? d?n c?a chuyn  gia ch?m [place] s?c kh?e. ?i?u ny l quan tr?ng.  S?p x?p bu?i khm m?t ngay sau khi ch?n ?on b?nh ti?u ???ng tp 2 v sau ? l hng n?m.  Th?c hi?n ch?m [place] da v bn chn hng ngy. Ki?m tra da v bn chn hng ngy xem c v?t c?t, v?t b?m tm, t?y ??, v?n ?? v? mng, ch?y mu, m?n n??c hay l? lot khng. Bn chn c?n ???c chuyn gia ch?m [place] s?c kh?e khm hng n?m.  ?nh r?ng v l?i t nh?t hai l?n m?i ngy  v dng ch? nha khoa t nh?t m?t l?n m?i ngy. G?p nha s? ?? khm l?i th??ng xuyn.  Chia s? k? ho?ch qu?n l b?nh ti?u ???ng c?a qu v? ? n?i lm vi?c ho?c tr??ng h?c c?a qu v?.  C?p nh?t l?ch tim ch?ng c?a qu v?. Qu v? nn tim v?c xin cm (b?nh cm) hng n?m. Qu v? c?ng nn tim v?c xin vim ph?i (ph? c?u khu?n). N?u qu v? 64 tu?i tr? ln v ch?a ???c tim v?c xin vim ph?i, v?c xin ny c th? ???c tim m?t lo?t hai m?i ring. Hy h?i chuyn gia ch?m [place] s?c kh?e xem nn tim thm lo?i v?c xin no.  H?c cch qu?n l c?ng th?ng.  Xin ???c gio d?c v h? tr? v? b?nh ti?u ???ng th??ng xuyn khi c?n.  Tham gia ho?c tm cch ph?c h?i ch?c n?ng khi c?n thi?t ?? duy tr ho?c c?i thi?n kh? n?ng ??c l?p v ch?t l??ng cu?c s?ng. Yu c?u chuy?n sang v?t l tr? li?u ho?c li?u php ngh? nghi?p n?u qu v? b? t bn chn ho?c t tay, ho?c kh ch?i ??u, kh m?c qu?n o, ?n u?ng ho?c ho?t ??ng th? ch?t. ?I [facility] N?U:   Qu v? khng th? ?n ho?c u?ng trong h?n 6 ti?ng.  Qu v? b? bu?n nn v nn m?a trong h?n 6 ti?ng.  L??ng ???ng huy?t c?a qu v? cao trn 240 mg/dL.  C thay ??i tr?ng thi tinh th?n.  Qu v? b? thm m?t c?n b?nh nghim tr?ng.  Qu v? b? tiu ch?y trong h?n 6 ti?ng.  Qu v? ? b? ?m ho?c b? s?t trong m?t vi ngy v khng ?? h?n.  Qu v? b? ?au trong khi tham gia b?t k? ho?t ??ng thn th? no. NGAY L?P T?C ?I [facility] N?U:  Qu v? b? kh th?.  Qu v? c l??ng ketone ? m?c trung bnh ??n cao.   Thng tin ny khng nh?m m?c ?ch thay th? cho l?i khuyn m chuyn gia ch?m  [place] s?c kh?e ni v?i qu v?. Hy b?o ??m qu v? ph?i th?o lu?n b?t k? v?n ?? g m qu v? c v?i chuyn gia ch?m [place] s?c kh?e c?a qu v?.   Document Released: 11/01/2005 Document Revised: 07/23/2015 Elsevier Interactive Patient Education [employer].

## 2015-09-12 ENCOUNTER — Encounter: Payer: Self-pay | Admitting: Family Medicine

## 2015-09-23 ENCOUNTER — Ambulatory Visit (HOSPITAL_BASED_OUTPATIENT_CLINIC_OR_DEPARTMENT_OTHER): Payer: Medicare HMO | Admitting: Oncology

## 2015-09-23 ENCOUNTER — Telehealth: Payer: Self-pay | Admitting: Oncology

## 2015-09-23 ENCOUNTER — Other Ambulatory Visit (HOSPITAL_BASED_OUTPATIENT_CLINIC_OR_DEPARTMENT_OTHER): Payer: Medicare HMO

## 2015-09-23 VITALS — BP 159/55 | HR 56 | Temp 98.0°F | Resp 17 | Ht 66.0 in | Wt 116.5 lb

## 2015-09-23 DIAGNOSIS — D72829 Elevated white blood cell count, unspecified: Secondary | ICD-10-CM

## 2015-09-23 DIAGNOSIS — D473 Essential (hemorrhagic) thrombocythemia: Secondary | ICD-10-CM | POA: Diagnosis not present

## 2015-09-23 DIAGNOSIS — E1140 Type 2 diabetes mellitus with diabetic neuropathy, unspecified: Secondary | ICD-10-CM

## 2015-09-23 DIAGNOSIS — D649 Anemia, unspecified: Secondary | ICD-10-CM | POA: Diagnosis not present

## 2015-09-23 DIAGNOSIS — D759 Disease of blood and blood-forming organs, unspecified: Secondary | ICD-10-CM

## 2015-09-23 DIAGNOSIS — D75839 Thrombocytosis, unspecified: Secondary | ICD-10-CM

## 2015-09-23 LAB — COMPREHENSIVE METABOLIC PANEL (CC13)
ALBUMIN: 5 g/dL (ref 3.5–5.0)
ALK PHOS: 81 U/L (ref 40–150)
ANION GAP: 8 meq/L (ref 3–11)
AST: 14 U/L (ref 5–34)
BILIRUBIN TOTAL: 0.51 mg/dL (ref 0.20–1.20)
BUN: 23.2 mg/dL (ref 7.0–26.0)
CO2: 26 mEq/L (ref 22–29)
CREATININE: 1.2 mg/dL (ref 0.7–1.3)
Calcium: 10.2 mg/dL (ref 8.4–10.4)
Chloride: 107 mEq/L (ref 98–109)
EGFR: 58 mL/min/{1.73_m2} — AB (ref >90)
Glucose: 176 mg/dl — ABNORMAL HIGH (ref 70–140)
Potassium: 4.9 mEq/L (ref 3.5–5.1)
Sodium: 141 mEq/L (ref 136–145)
TOTAL PROTEIN: 7.7 g/dL (ref 6.4–8.3)

## 2015-09-23 LAB — CBC WITH DIFFERENTIAL/PLATELET
BASO%: 1.1 % (ref 0.0–2.0)
Basophils Absolute: 0.3 10*3/uL — ABNORMAL HIGH (ref 0.0–0.1)
EOS ABS: 0.4 10*3/uL (ref 0.0–0.5)
EOS%: 1.4 % (ref 0.0–7.0)
HEMATOCRIT: 46.1 % (ref 38.4–49.9)
HEMOGLOBIN: 14.6 g/dL (ref 13.0–17.1)
LYMPH#: 0.7 10*3/uL — AB (ref 0.9–3.3)
LYMPH%: 2.8 % — ABNORMAL LOW (ref 14.0–49.0)
MCH: 27.9 pg (ref 27.2–33.4)
MCHC: 31.7 g/dL — ABNORMAL LOW (ref 32.0–36.0)
MCV: 87.9 fL (ref 79.3–98.0)
MONO#: 0.8 10*3/uL (ref 0.1–0.9)
MONO%: 3.1 % (ref 0.0–14.0)
NEUT%: 91.6 % — ABNORMAL HIGH (ref 39.0–75.0)
NEUTROS ABS: 23.9 10*3/uL — AB (ref 1.5–6.5)
PLATELETS: 660 10*3/uL — AB (ref 140–400)
RBC: 5.24 10*6/uL (ref 4.20–5.82)
RDW: 15.5 % — AB (ref 11.0–14.6)
WBC: 26.1 10*3/uL — AB (ref 4.0–10.3)

## 2015-09-23 LAB — TECHNOLOGIST REVIEW

## 2015-09-23 NOTE — Addendum Note (Signed)
Addended by: Randolm Idol on: 09/23/2015 10:40 AM   Modules accepted: Medications

## 2015-09-23 NOTE — Progress Notes (Signed)
Hematology and Oncology Follow Up Visit  [name] [id number] 1941-03-16 74 y.o. 09/23/2015 10:02 AM    CC: Nicholas Boyer, MD    Principle Diagnosis: A 74 year old [place] gentleman with leukocytosis and thrombocytosis likely related due to essential thrombocythemia diagnosed in 2012.  He presented with a white cell count of 19,000, hemoglobin 14, platelet count was 841.  His workup showed a JAK2 mutations have been positive, indicating evidence against a myeloproliferative disorder. His BCR-Abl was negative.    Current therapy: Observation and surveillance. Currently on aspirin 81 mg daily.  Interim History:  [name] presents today for a followup visit with an interpreter. Since the last visit, he reports doing about the same. He does have long-standing diabetes and peripheral neuropathy related to it. He continues to have issues with digesting food because of lack of teeth. He lost about 10 pounds in the last 6 months. He has not reported any bleeding or thrombosis episodes. Has not reported any recent hospitalization.   He is not reporting any chest pain, does not report any difficulty breathing, is not reporting any major changes in his performance status, is not reporting any change in his appetite or development of constitutional symptoms.  He had not reported any headaches or blurry vision.Marland Kitchen He report tingling sensation in his lower extremities likely resembling neuropathy. Rest of his review of systems unremarkable.  Medications: I have reviewed the patient's current medications. Current Outpatient Prescriptions  Medication Sig Dispense Refill  . aspirin 81 MG tablet Take 81 mg by mouth daily.      Marland Kitchen gabapentin (NEURONTIN) 100 MG capsule Take 2 tablets 2 times or 3 times daily for foot pain 540 capsule 3  . metFORMIN (GLUCOPHAGE) 500 MG tablet Take 2 tablets (1,000 mg total) by mouth 2 (two) times daily with a meal. 120 tablet 5  . Multiple Vitamin (MULTIVITAMIN) tablet Take 1  tablet by mouth daily.      . vitamin k 100 MCG tablet Take 100 mcg by mouth daily.       No current facility-administered medications for this visit.    Allergies: No Known Allergies  Past Medical History, Surgical history, Social history, and Family History were reviewed and updated.   Physical Exam: Blood pressure 159/55, pulse 56, temperature 98 F (36.7 C), temperature source Oral, resp. rate 17, height 5\' 6"  (1.676 m), weight 116 lb 8 oz (52.844 kg), SpO2 100 %. ECOG: 1 General appearance: alert awake well-appearing gentleman. Chronically ill-appearing. Head: Normocephalic, without obvious abnormality. No teeth noted. Neck: no adenopathy Lymph nodes: Cervical, supraclavicular, and axillary nodes normal. Heart:regular rate and rhythm, S1, S2 normal, no murmur, click, rub or gallop Lung:chest clear, no wheezing, rales, normal symmetric air entry Abdomen: soft, nondistended, normal bowel sounds and splenic tip palpable no shifting dullness or ascites. EXT:no erythema, induration, or nodules   Lab Results: Lab Results  Component Value Date   WBC 26.1* 09/23/2015   HGB 14.6 09/23/2015   HCT 46.1 09/23/2015   MCV 87.9 09/23/2015   PLT 660* 09/23/2015     Chemistry      Component Value Date/Time   NA 140 09/06/2015 1106   NA 144 03/18/2015 0951   K 4.7 09/06/2015 1106   K 4.1 03/18/2015 0951   CL 102 09/06/2015 1106   CL 105 02/07/2013 0942   CO2 27 09/06/2015 1106   CO2 27 03/18/2015 0951   BUN 20 09/06/2015 1106   BUN 16.4 03/18/2015 0951   CREATININE 0.97  09/06/2015 1106   CREATININE 0.9 03/18/2015 0951   CREATININE 0.67 08/10/2011 0600      Component Value Date/Time   CALCIUM 9.7 09/06/2015 1106   CALCIUM 9.1 03/18/2015 0951   ALKPHOS 68 09/06/2015 1106   ALKPHOS 66 03/18/2015 0951   AST 11 09/06/2015 1106   AST 12 03/18/2015 0951   ALT 6* 09/06/2015 1106   ALT 7 03/18/2015 0951   BILITOT 1.0 09/06/2015 1106   BILITOT 0.65 03/18/2015 0951        Impression and Plan:  This is a pleasant 74 year old gentleman with the following issues:   1. Leukocytosis and thrombocytosis, differential diagnosis including a myeloproliferative disorder. 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 His leukocytosis is rather mild and his platelet count remained close to 600,000. I have recommended continued observation and surveillance and use hydroxyurea to reduce his platelet count of the core close to 800. He has no previous history of thrombosis. 2. Thrombosis prophylaxis: He is currently on aspirin 81 mg daily. 3. Anemia. His hemoglobin is currently within normal range. 4. Diabetic neuropathy: managed by his PCP.  5. Weight loss: Mostly related to his lack of teeth. I have instructed him to use nutritional supplements and [employer] instant breakfast among others. I also recommended evaluation for possible dentures. 6. Follow-up. In 6 months    [name] 11/8/201610:02 AM

## 2015-09-23 NOTE — Telephone Encounter (Signed)
Gave and printed appt sched and avs fo rpt for May 2017 °

## 2016-03-22 ENCOUNTER — Telehealth: Payer: Self-pay | Admitting: Oncology

## 2016-03-22 NOTE — Telephone Encounter (Signed)
returned call and s.w. pt and r/s appt per pt request....pt ok and aware and ok

## 2016-03-24 ENCOUNTER — Other Ambulatory Visit: Payer: Medicare HMO

## 2016-03-24 ENCOUNTER — Ambulatory Visit: Payer: Medicare HMO | Admitting: Oncology

## 2016-04-21 ENCOUNTER — Other Ambulatory Visit (HOSPITAL_BASED_OUTPATIENT_CLINIC_OR_DEPARTMENT_OTHER): Payer: Medicare HMO

## 2016-04-21 ENCOUNTER — Telehealth: Payer: Self-pay | Admitting: Oncology

## 2016-04-21 ENCOUNTER — Ambulatory Visit (HOSPITAL_BASED_OUTPATIENT_CLINIC_OR_DEPARTMENT_OTHER): Payer: Medicare HMO | Admitting: Oncology

## 2016-04-21 VITALS — BP 143/48 | HR 57 | Temp 98.1°F | Resp 18 | Ht 66.0 in | Wt 113.9 lb

## 2016-04-21 DIAGNOSIS — D72829 Elevated white blood cell count, unspecified: Secondary | ICD-10-CM

## 2016-04-21 DIAGNOSIS — D75839 Thrombocytosis, unspecified: Secondary | ICD-10-CM

## 2016-04-21 DIAGNOSIS — E1140 Type 2 diabetes mellitus with diabetic neuropathy, unspecified: Secondary | ICD-10-CM | POA: Diagnosis not present

## 2016-04-21 DIAGNOSIS — D649 Anemia, unspecified: Secondary | ICD-10-CM | POA: Diagnosis not present

## 2016-04-21 DIAGNOSIS — R161 Splenomegaly, not elsewhere classified: Secondary | ICD-10-CM

## 2016-04-21 DIAGNOSIS — D473 Essential (hemorrhagic) thrombocythemia: Secondary | ICD-10-CM

## 2016-04-21 LAB — CBC WITH DIFFERENTIAL/PLATELET
BASO%: 0.9 % (ref 0.0–2.0)
BASOS ABS: 0.3 10*3/uL — AB (ref 0.0–0.1)
EOS ABS: 0.2 10*3/uL (ref 0.0–0.5)
EOS%: 0.6 % (ref 0.0–7.0)
HEMATOCRIT: 41.1 % (ref 38.4–49.9)
HEMOGLOBIN: 13 g/dL (ref 13.0–17.1)
LYMPH#: 0.5 10*3/uL — AB (ref 0.9–3.3)
LYMPH%: 1.4 % — ABNORMAL LOW (ref 14.0–49.0)
MCH: 27.7 pg (ref 27.2–33.4)
MCHC: 31.7 g/dL — ABNORMAL LOW (ref 32.0–36.0)
MCV: 87.3 fL (ref 79.3–98.0)
MONO#: 1.1 10*3/uL — ABNORMAL HIGH (ref 0.1–0.9)
MONO%: 2.8 % (ref 0.0–14.0)
NEUT%: 94.3 % — ABNORMAL HIGH (ref 39.0–75.0)
NEUTROS ABS: 35.4 10*3/uL — AB (ref 1.5–6.5)
Platelets: 631 10*3/uL — ABNORMAL HIGH (ref 140–400)
RBC: 4.71 10*6/uL (ref 4.20–5.82)
RDW: 16.1 % — AB (ref 11.0–14.6)
WBC: 37.6 10*3/uL — AB (ref 4.0–10.3)

## 2016-04-21 LAB — COMPREHENSIVE METABOLIC PANEL
ALBUMIN: 4.2 g/dL (ref 3.5–5.0)
ALK PHOS: 89 U/L (ref 40–150)
AST: 12 U/L (ref 5–34)
Anion Gap: 8 mEq/L (ref 3–11)
BILIRUBIN TOTAL: 0.77 mg/dL (ref 0.20–1.20)
BUN: 18.7 mg/dL (ref 7.0–26.0)
CALCIUM: 9 mg/dL (ref 8.4–10.4)
CO2: 28 mEq/L (ref 22–29)
CREATININE: 0.9 mg/dL (ref 0.7–1.3)
Chloride: 104 mEq/L (ref 98–109)
EGFR: 80 mL/min/{1.73_m2} — ABNORMAL LOW (ref >90)
GLUCOSE: 144 mg/dL — AB (ref 70–140)
Potassium: 4.2 mEq/L (ref 3.5–5.1)
SODIUM: 139 meq/L (ref 136–145)
TOTAL PROTEIN: 6.8 g/dL (ref 6.4–8.3)

## 2016-04-21 NOTE — Addendum Note (Signed)
Addended by: Randolm Idol on: 04/21/2016 10:23 AM   Modules accepted: Medications

## 2016-04-21 NOTE — Progress Notes (Signed)
Barb neff nutritionilist gave samples of boost and ensure, along with discount coupons for more.

## 2016-04-21 NOTE — Telephone Encounter (Signed)
Gave and printed appt sched and avs for pt for Sept °

## 2016-04-21 NOTE — Progress Notes (Signed)
Hematology and Oncology Follow Up Visit  [name] [id number] August 09, 1941 75 y.o. 04/21/2016 10:02 AM    CC: Nicholas Boyer, MD    Principle Diagnosis: A 75 year old [place] gentleman with Myeloproliferative disorder. He presented with leukocytosis and thrombocytosis likely related essential thrombocythemia diagnosed in 2012.  He presented with a white cell count of 19,000, hemoglobin 14, platelet count was 841.  His workup showed a JAK2 mutations have been positive. His BCR-Abl was negative.    Current therapy: Observation and surveillance. Currently on aspirin 81 mg daily.  Interim History:  [name] presents today for a followup visit with an interpreter. Since the last visit, he reports no major changes in his health. He does report decrease in his appetite and 2 pound weight loss in the last 6 months. His weight loss is multifactorial including poor dentition as well as diabetes. He feels his appetite is good and increased certain foods. He does have long-standing diabetes and peripheral neuropathy related to it which affected his mobility at times. He reports no falls or syncope. He has not reported any bleeding or thrombosis episodes. Has not reported any recent hospitalization. He does report some occasional abdominal fullness in the left upper quadrant.  He is not reporting any chest pain, does not report any difficulty breathing, is not reporting any major changes in his performance status, is not reporting any change in his appetite or development of constitutional symptoms.  He had not reported any headaches or blurry vision. Does not report any nausea, vomiting but does report occasional constipation. Does not report any frequency urgency or hesitancy. Rest of his review of systems unremarkable.  Medications: I have reviewed the patient's current medications. Current Outpatient Prescriptions  Medication Sig Dispense Refill  . aspirin 81 MG tablet Take 81 mg by mouth daily.      Marland Kitchen  gabapentin (NEURONTIN) 100 MG capsule Take 2 tablets 2 times or 3 times daily for foot pain 540 capsule 3  . metFORMIN (GLUCOPHAGE) 500 MG tablet Take 2 tablets (1,000 mg total) by mouth 2 (two) times daily with a meal. 120 tablet 5  . Multiple Vitamin (MULTIVITAMIN) tablet Take 1 tablet by mouth daily.      . vitamin k 100 MCG tablet Take 100 mcg by mouth daily.       No current facility-administered medications for this visit.    Allergies: No Known Allergies  Past Medical History, Surgical history, Social history, and Family History were reviewed and updated.   Physical Exam: Blood pressure 143/48, pulse 57, temperature 98.1 F (36.7 C), temperature source Oral, resp. rate 18, height 5\' 6"  (1.676 m), weight 113 lb 14.4 oz (51.665 kg), SpO2 99 %. ECOG: 1 General appearance: Chronically ill-appearing Gentleman without distress. Head: Normocephalic, without obvious abnormality. No teeth noted. Neck: no adenopathy Lymph nodes: Cervical, supraclavicular, and axillary nodes normal. Heart:regular rate and rhythm, S1, S2 normal, no murmur, click, rub or gallop Lung:chest clear, no wheezing, rales, normal symmetric air entry Abdomen: soft, nondistended, normal bowel sounds and splenic tip palpable no shifting dullness or ascites. EXT:no erythema, induration, or nodules   Lab Results: Lab Results  Component Value Date   WBC 37.6* 04/21/2016   HGB 13.0 04/21/2016   HCT 41.1 04/21/2016   MCV 87.3 04/21/2016   PLT 631* 04/21/2016     Chemistry      Component Value Date/Time   NA 141 09/23/2015 0912   NA 140 09/06/2015 1106   K 4.9 09/23/2015 0912  K 4.7 09/06/2015 1106   CL 102 09/06/2015 1106   CL 105 02/07/2013 0942   CO2 26 09/23/2015 0912   CO2 27 09/06/2015 1106   BUN 23.2 09/23/2015 0912   BUN 20 09/06/2015 1106   CREATININE 1.2 09/23/2015 0912   CREATININE 0.97 09/06/2015 1106   CREATININE 0.67 08/10/2011 0600      Component Value Date/Time   CALCIUM 10.2  09/23/2015 0912   CALCIUM 9.7 09/06/2015 1106   ALKPHOS 81 09/23/2015 0912   ALKPHOS 68 09/06/2015 1106   AST 14 09/23/2015 0912   AST 11 09/06/2015 1106   ALT <9 09/23/2015 0912   ALT 6* 09/06/2015 1106   BILITOT 0.51 09/23/2015 0912   BILITOT 1.0 09/06/2015 1106       Impression and Plan:  This is a pleasant 75 year old gentleman with the following issues:   1. Myeloproliferative disorder presented with Leukocytosis and thrombocytosis: This likely represents essential thrombocythemia potentially myelofibrosis. He has JAK- 2 positive mutation. From a management standpoint, he is not great candidate for cytoreductive therapy given the complications that could be associated with hydroxyurea. He is minimally symptomatic at this time. I will obtain an ultrasound to assess his spleen size for surveillance purposes. I will repeat his BCR able to ensure he is not developing chronic myelogenous leukemia. 2. Thrombosis prophylaxis: He is currently on aspirin 81 mg daily. 3. Anemia. His hemoglobin is currently within normal range. 4. Diabetic neuropathy: managed by his PCP.  5. 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 He was given nutritional supplements and instructions how to use dose. 6. Follow-up. In 3 months    [name] 6/7/201710:02 AM

## 2016-05-03 ENCOUNTER — Ambulatory Visit (HOSPITAL_COMMUNITY)
Admission: RE | Admit: 2016-05-03 | Discharge: 2016-05-03 | Disposition: A | Discharge status: Home / Self Care | Payer: Medicare HMO | Source: Ambulatory Visit | Attending: Oncology | Admitting: Oncology

## 2016-05-03 DIAGNOSIS — R161 Splenomegaly, not elsewhere classified: Secondary | ICD-10-CM

## 2016-05-03 DIAGNOSIS — D7389 Other diseases of spleen: Secondary | ICD-10-CM | POA: Diagnosis not present

## 2016-05-03 DIAGNOSIS — D72829 Elevated white blood cell count, unspecified: Secondary | ICD-10-CM | POA: Insufficient documentation

## 2016-08-03 ENCOUNTER — Telehealth: Payer: Self-pay | Admitting: Oncology

## 2016-08-03 ENCOUNTER — Ambulatory Visit (HOSPITAL_BASED_OUTPATIENT_CLINIC_OR_DEPARTMENT_OTHER): Payer: Medicare HMO | Admitting: Oncology

## 2016-08-03 ENCOUNTER — Other Ambulatory Visit (HOSPITAL_BASED_OUTPATIENT_CLINIC_OR_DEPARTMENT_OTHER): Payer: Medicare HMO

## 2016-08-03 VITALS — BP 137/62 | HR 58 | Temp 97.8°F | Resp 18 | Ht 66.0 in | Wt 109.5 lb

## 2016-08-03 DIAGNOSIS — D473 Essential (hemorrhagic) thrombocythemia: Secondary | ICD-10-CM

## 2016-08-03 DIAGNOSIS — R161 Splenomegaly, not elsewhere classified: Secondary | ICD-10-CM

## 2016-08-03 DIAGNOSIS — D649 Anemia, unspecified: Secondary | ICD-10-CM

## 2016-08-03 DIAGNOSIS — D75839 Thrombocytosis, unspecified: Secondary | ICD-10-CM

## 2016-08-03 DIAGNOSIS — D72829 Elevated white blood cell count, unspecified: Secondary | ICD-10-CM

## 2016-08-03 LAB — CBC WITH DIFFERENTIAL/PLATELET
BASO%: 0.3 % (ref 0.0–2.0)
BASOS ABS: 0.1 10*3/uL (ref 0.0–0.1)
EOS ABS: 0.4 10*3/uL (ref 0.0–0.5)
EOS%: 0.9 % (ref 0.0–7.0)
HEMATOCRIT: 44.6 % (ref 38.4–49.9)
HEMOGLOBIN: 14 g/dL (ref 13.0–17.1)
LYMPH#: 0.6 10*3/uL — AB (ref 0.9–3.3)
LYMPH%: 1.4 % — ABNORMAL LOW (ref 14.0–49.0)
MCH: 27.2 pg (ref 27.2–33.4)
MCHC: 31.3 g/dL — ABNORMAL LOW (ref 32.0–36.0)
MCV: 86.9 fL (ref 79.3–98.0)
MONO#: 0.8 10*3/uL (ref 0.1–0.9)
MONO%: 2 % (ref 0.0–14.0)
NEUT%: 95.4 % — ABNORMAL HIGH (ref 39.0–75.0)
NEUTROS ABS: 39.9 10*3/uL — AB (ref 1.5–6.5)
Platelets: 717 10*3/uL — ABNORMAL HIGH (ref 140–400)
RBC: 5.14 10*6/uL (ref 4.20–5.82)
RDW: 16 % — AB (ref 11.0–14.6)
WBC: 41.8 10*3/uL — AB (ref 4.0–10.3)

## 2016-08-03 LAB — COMPREHENSIVE METABOLIC PANEL
ALBUMIN: 4.3 g/dL (ref 3.5–5.0)
ALK PHOS: 105 U/L (ref 40–150)
ALT: 9 U/L (ref 0–55)
AST: 13 U/L (ref 5–34)
Anion Gap: 11 mEq/L (ref 3–11)
BILIRUBIN TOTAL: 0.54 mg/dL (ref 0.20–1.20)
BUN: 36.4 mg/dL — AB (ref 7.0–26.0)
CALCIUM: 9.7 mg/dL (ref 8.4–10.4)
CO2: 26 mEq/L (ref 22–29)
CREATININE: 1.2 mg/dL (ref 0.7–1.3)
Chloride: 105 mEq/L (ref 98–109)
EGFR: 59 mL/min/{1.73_m2} — ABNORMAL LOW (ref >90)
GLUCOSE: 125 mg/dL (ref 70–140)
POTASSIUM: 4.5 meq/L (ref 3.5–5.1)
SODIUM: 142 meq/L (ref 136–145)
TOTAL PROTEIN: 7.2 g/dL (ref 6.4–8.3)

## 2016-08-03 LAB — TECHNOLOGIST REVIEW

## 2016-08-03 NOTE — Progress Notes (Signed)
Hematology and Oncology Follow Up Visit  FLORENTINO DANTIN [id number] Sep 17, 1941 75 y.o. 08/03/2016 10:13 AM    CC: [name], MD    Principle Diagnosis: A 75 year old [place] gentleman with Myeloproliferative disorder. He presented with leukocytosis and thrombocytosis likely related essential thrombocythemia diagnosed in 2012.  He presented with a white cell count of 19,000, hemoglobin 14, platelet count was 841.  His workup showed a JAK2 mutations have been positive. His BCR-Abl was negative.    Current therapy: Observation and surveillance. Currently on aspirin 81 mg daily.  Interim History:  Mr. Stifel presents today for a followup visit with an interpreter. Since the last visit, he reports no recent complaints. Has not reported any recent hospitalization. He does report some occasional abdominal fullness in the left upper quadrant. He does not report any nausea or vomiting. He does not report any major changes in his performance status. Has not reported any thrombosis or bleeding episodes.  He continues to have issues with weight dropped 5 pounds last 6 months. He reports most of his issues related to not having teeth and findings foods he is able to swallow.  He is not reporting any chest pain, does not report any difficulty breathing, is not reporting any major changes in his performance status, is not reporting any change in his appetite or development of constitutional symptoms.  He had not reported any headaches or blurry vision. Does not report any nausea, vomiting but does report occasional constipation. Does not report any frequency urgency or hesitancy. Rest of his review of systems unremarkable.  Medications: I have reviewed the patient's current medications. Current Outpatient Prescriptions  Medication Sig Dispense Refill  . aspirin 81 MG tablet Take 81 mg by mouth daily.      Marland Kitchen gabapentin (NEURONTIN) 100 MG capsule Take 2 tablets 2 times or 3 times daily for foot pain 540  capsule 3  . metFORMIN (GLUCOPHAGE) 500 MG tablet Take 2 tablets (1,000 mg total) by mouth 2 (two) times daily with a meal. 120 tablet 5  . Multiple Vitamin (MULTIVITAMIN) tablet Take 1 tablet by mouth daily.      . vitamin k 100 MCG tablet Take 100 mcg by mouth daily.       No current facility-administered medications for this visit.     Allergies: No Known Allergies  Past Medical History, Surgical history, Social history, and Family History were reviewed and updated.   Physical Exam: Blood pressure 137/62, pulse (!) 58, temperature 97.8 F (36.6 C), temperature source Oral, resp. rate 18, height 5\' 6"  (1.676 m), weight 109 lb 8 oz (49.7 kg), SpO2 100 %. ECOG: 1 General appearance: Alert, awake gentleman without distress. Head: Normocephalic, without obvious abnormality. No teeth noted. Oral ulcers or lesions. Neck: no adenopathy Lymph nodes: Cervical, supraclavicular, and axillary nodes normal. Heart:regular rate and rhythm, S1, S2 normal, no murmur, click, rub or gallop Lung:chest clear, no wheezing, rales, normal symmetric air entry Abdomen: soft, nondistended, normal bowel sounds and splenic tip palpable no shifting dullness or ascites. EXT:no erythema, induration, or nodules   Lab Results: Lab Results  Component Value Date   WBC 41.8 (H) 08/03/2016   HGB 14.0 08/03/2016   HCT 44.6 08/03/2016   MCV 86.9 08/03/2016   PLT 717 (H) 08/03/2016     Chemistry      Component Value Date/Time   NA 139 04/21/2016 0920   K 4.2 04/21/2016 0920   CL 102 09/06/2015 1106   CL 105 02/07/2013 0942  CO2 28 04/21/2016 0920   BUN 18.7 04/21/2016 0920   CREATININE 0.9 04/21/2016 0920      Component Value Date/Time   CALCIUM 9.0 04/21/2016 0920   ALKPHOS 89 04/21/2016 0920   AST 12 04/21/2016 0920   ALT <9 04/21/2016 0920   BILITOT 0.77 04/21/2016 0920       Impression and Plan:  This is a pleasant 75 year old gentleman with the following issues:   1. Myeloproliferative  disorder presented with Leukocytosis and thrombocytosis: This likely represents essential thrombocythemia potentially myelofibrosis. He has JAK- 2 positive mutation. From a management standpoint, he is not great candidate for cytoreductive therapy given the complications that could be associated with hydroxyurea. The plan is to continue with supportive management at this time and consider cytoreductive therapy in the future. 2. Thrombosis prophylaxis: He is currently on aspirin 81 mg daily. 3. Anemia. His hemoglobin is currently within normal range. 4. Diabetic neuropathy: managed by his PCP.  5. 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 We have discussed different strategy to improve his nutritional intake at this time. 6. Splenomegaly: This is related to myeloproliferative disorder. His ultrasound in June 2017 did not show any dramatic changes in his spleen size. He is asymptomatic at this time and we will continue to monitor this clinically. Anti-JAK2 medication could be considered if he develops any constitutional symptoms. 7. Follow-up. In 6 months    SHADAD,FIRAS 9/19/201710:13 AM

## 2016-08-03 NOTE — Addendum Note (Signed)
Addended by: [name] on: 08/03/2016 11:22 AM   Modules accepted: Orders

## 2016-08-03 NOTE — Telephone Encounter (Signed)
Avs report and appointment schedule given to patient per 08/03/16 los. °

## 2016-09-23 ENCOUNTER — Other Ambulatory Visit: Payer: Self-pay

## 2016-09-23 DIAGNOSIS — E1140 Type 2 diabetes mellitus with diabetic neuropathy, unspecified: Secondary | ICD-10-CM

## 2016-09-23 MED ORDER — METFORMIN HCL 500 MG PO TABS: 1000.0000 mg | ORAL_TABLET | Freq: Two times a day (BID) | ORAL | 0 refills | Status: DC

## 2016-09-23 MED ORDER — GABAPENTIN 100 MG PO CAPS: ORAL_CAPSULE | ORAL | 0 refills | Status: DC

## 2016-09-23 NOTE — Telephone Encounter (Signed)
Last ov and refill 08/2015

## 2016-09-23 NOTE — Telephone Encounter (Signed)
I have given a month supply to allow the patient time to get to the clinic of both medications.

## 2016-10-15 ENCOUNTER — Telehealth: Payer: Self-pay

## 2016-10-15 NOTE — Telephone Encounter (Signed)
Patient is calling to request a refill of his gabapentin and metaformin.  I told him that he would probably need to be seen to refill his medication.  Appointment is made for tomorrow.

## 2016-10-16 ENCOUNTER — Ambulatory Visit (INDEPENDENT_AMBULATORY_CARE_PROVIDER_SITE_OTHER): Payer: Medicare HMO | Admitting: Osteopathic Medicine

## 2016-10-16 VITALS — BP 128/80 | HR 57 | Temp 97.6°F | Resp 17 | Ht 66.0 in | Wt 109.0 lb

## 2016-10-16 DIAGNOSIS — J069 Acute upper respiratory infection, unspecified: Secondary | ICD-10-CM | POA: Diagnosis not present

## 2016-10-16 DIAGNOSIS — D759 Disease of blood and blood-forming organs, unspecified: Secondary | ICD-10-CM | POA: Diagnosis not present

## 2016-10-16 DIAGNOSIS — E1140 Type 2 diabetes mellitus with diabetic neuropathy, unspecified: Secondary | ICD-10-CM | POA: Diagnosis not present

## 2016-10-16 DIAGNOSIS — B9789 Other viral agents as the cause of diseases classified elsewhere: Secondary | ICD-10-CM

## 2016-10-16 DIAGNOSIS — E119 Type 2 diabetes mellitus without complications: Secondary | ICD-10-CM

## 2016-10-16 LAB — COMPLETE METABOLIC PANEL WITH GFR
ALBUMIN: 4.8 g/dL (ref 3.6–5.1)
ALK PHOS: 107 U/L (ref 40–115)
ALT: 7 U/L — ABNORMAL LOW (ref 9–46)
AST: 13 U/L (ref 10–35)
BUN: 21 mg/dL (ref 7–25)
CALCIUM: 9.3 mg/dL (ref 8.6–10.3)
CO2: 27 mmol/L (ref 20–31)
Chloride: 96 mmol/L — ABNORMAL LOW (ref 98–110)
Creat: 0.89 mg/dL (ref 0.70–1.18)
GFR, EST NON AFRICAN AMERICAN: 83 mL/min (ref >=60)
Glucose, Bld: 123 mg/dL — ABNORMAL HIGH (ref 65–99)
Potassium: 4.3 mmol/L (ref 3.5–5.3)
SODIUM: 134 mmol/L — AB (ref 135–146)
Total Bilirubin: 0.7 mg/dL (ref 0.2–1.2)
Total Protein: 7.1 g/dL (ref 6.1–8.1)

## 2016-10-16 LAB — LIPID PANEL
CHOL/HDL RATIO: 3.6 ratio (ref <5.0)
CHOLESTEROL: 71 mg/dL (ref <200)
HDL: 20 mg/dL — AB (ref >40)
LDL Cholesterol: 36 mg/dL (ref <100)
Triglycerides: 76 mg/dL (ref <150)
VLDL: 15 mg/dL (ref <30)

## 2016-10-16 MED ORDER — BENZONATATE 200 MG PO CAPS: 200.0000 mg | ORAL_CAPSULE | Freq: Three times a day (TID) | ORAL | 0 refills | Status: AC | PRN

## 2016-10-16 MED ORDER — METFORMIN HCL 500 MG PO TABS: 1000.0000 mg | ORAL_TABLET | Freq: Two times a day (BID) | ORAL | 3 refills | Status: AC

## 2016-10-16 MED ORDER — GABAPENTIN 100 MG PO CAPS: ORAL_CAPSULE | ORAL | 3 refills | Status: AC

## 2016-10-16 MED ORDER — IPRATROPIUM BROMIDE 0.03 % NA SOLN: 2.0000 | Freq: Three times a day (TID) | NASAL | 0 refills | Status: AC | PRN

## 2016-10-16 NOTE — Progress Notes (Signed)
HPI: [name] is a 75 y.o. male  who presents to [facility] today, 10/16/16,  for chief complaint of:  Chief Complaint  Patient presents with  . Medication Refill    Neurontin and glucophage     History obtained with assistance of son due to rare chinese dialect (not Mandarin)  Diabetes:  needs refill of medications  Weight loss Following with HemOnc Taking high protein shakes and supplements   Cough Son reports coughing and sinus congestion x3 days, no fever, no SOB  Previous records reviewed.  Following with oncology for myeloproliferative disorder with leukocytosis and thrombocytosis, includes observation/surveillance, aspirin 81 mg daily. Plan to consider cytoreductive therapy in the future.  Last follow-[facility] for [facility] at [facility] was over 1 year ago. A1c at that point was 6.7. Cholesterol was well controlled. Kidney function normal.   Patient is accompanied by son and wife who assists with history-taking.   Past medical, surgical, social and family history reviewed: Past Medical History:  Diagnosis Date  . Anemia    Hx of   . Diabetes mellitus, type 2 ([place])   . GERD (gastroesophageal reflux disease)   . Leukocytosis   . Thrombocytosis ([place])    Past Surgical History:  Procedure Laterality Date  . HERNIA REPAIR     Social History  Substance Use Topics  . Smoking status: Never Smoker  . Smokeless tobacco: Never Used  . Alcohol use Yes     Comment: occ   Family History  Problem Relation Age of Onset  . Colon cancer Neg Hx      Current medication list and allergy/intolerance information reviewed:   Current Outpatient Prescriptions  Medication Sig Dispense Refill  . aspirin 81 MG tablet Take 81 mg by mouth daily.      Marland Kitchen gabapentin (NEURONTIN) 100 MG capsule Take 2 tablets 2 times or 3 times daily for foot pain 180 capsule 0  . metFORMIN (GLUCOPHAGE) 500 MG tablet Take 2 tablets (1,000 mg total) by mouth 2 (two) times daily with a  meal. 120 tablet 0  . Multiple Vitamin (MULTIVITAMIN) tablet Take 1 tablet by mouth daily.      . vitamin k 100 MCG tablet Take 100 mcg by mouth daily.       No current facility-administered medications for this visit.    No Known Allergies    Review of Systems:  Constitutional:  No  fever, no chills, No recent illness, +unintentional weight changes  HEENT: No  Headache, +sinus drainage, +sore throat  Cardiac: No  chest pain, No  pressure  Respiratory:  No  shortness of breath. +Cough  Gastrointestinal: No  abdominal pain  Neurologic: No  weakness, No  Dizziness   Exam:  BP 128/80 (BP Location: Left Arm, Patient Position: Sitting, Cuff Size: Normal)   Pulse (!) 57   Temp 97.6 F (36.4 C) (Oral)   Resp 17   Ht 5\' 6"  (1.676 m)   Wt 109 lb (49.4 kg)   SpO2 99%   BMI 17.59 kg/m   Constitutional: VS see above. General Appearance: alert, well-developed, thin, NAD  Eyes: Normal lids and conjunctive, non-icteric sclera  Ears, Nose, Mouth, Throat: MMM, Normal external inspection ears/nares/mouth/lips/gums. No pharyngeal erythema/exudate   Neck: No masses, trachea midline. No lymphadenopathy  Respiratory: Normal respiratory effort. no wheeze, no rhonchi, no rales  Cardiovascular: S1/S2 normal, no murmur, no rub/gallop auscultated. RRR. No lower extremity edema  Musculoskeletal: Gait normal. No clubbing/cyanosis of digits.  Neurological:  Normal balance/coordination. No tremor.   Skin: warm, dry, intact. No rash/ulcer.       ASSESSMENT/PLAN:   Supportive care for viral URI symptoms, no fever or SOB, RTC if no better, declines CXR today  Refill meds  RTC to establish with PCP (I am PRN provider)   Type 2 diabetes mellitus without complication, without long-term current use of insulin ([place]) - Plan: COMPLETE METABOLIC PANEL WITH GFR, Lipid panel, Hemoglobin A1c, CANCELED: POCT glycosylated hemoglobin (Hb A1C)  Blood dyscrasia - Following with  oncology/hematology  Type 2 diabetes, controlled, with neuropathy ([place]) - Plan: gabapentin (NEURONTIN) 100 MG capsule, metFORMIN (GLUCOPHAGE) 500 MG tablet  Viral URI with cough - Plan: benzonatate (TESSALON) 200 MG capsule, ipratropium (ATROVENT) 0.03 % nasal spray    Patient Instructions   Aches/Pains, Fever Acetaminophen (Tylenol) 500 mg tablets - take max 2 tablets (1000 mg) every 6 hours (4 times per day)  Ibuprofen (Motrin) 200 mg tablets - take max 4 tablets (800 mg) every 6 hours  Sinus Congestion Cromolyn Nasal Spray (NasalCrom) 1 spray each nostril 3-4 times per day, max 6 imes per day Nasal Saline if desired Oxymetolazone (Afrin, others) sparing use due to rebound congestion Phenylephrine PE (Sudafed) 10 mg tablets every 4 hours (or the 12-hour formulation) Diphenhydramine (Benadryl) 25 mg tablets - take max 2 tablets every 4 hours  Cough Dextromethorphan (Robitussin, others) - cough suppressant Guaifenesin (Robitussin, Mucinex, others) - expectorant (helps cough up mucus) The above medications also come in a combination tablet Lozenges w/ Benzocaine + Menthol (Cepacol) Honey - as much as you want Teas which "coat the throat" - look for ingredients Elm Bark, Licorice Root, Marshmallow Root  Other Zinc Lozenges within 24 hours of symptoms onset - mixed evidence this shortens the duration of the common cold Don't waste your money on Vitamin C or Echinacea      IF you received an x-ray today, you will receive an invoice from [facility]. Please contact [facility] at [phone] with questions or concerns regarding your invoice.   IF you received labwork today, you will receive an invoice from [employer]. Please contact [facility] at [phone] with questions or concerns regarding your invoice.   Our billing staff will not be able to assist you with questions regarding bills from these companies.  You will be contacted  with the lab results as soon as they are available. The fastest way to get your results is to activate your My Chart account. Instructions are located on the last page of this paperwork. If you have not heard from [place] regarding the results in 2 weeks, please contact this office.            Visit summary with medication list and pertinent instructions was printed for patient to review. All questions at time of visit were answered - patient instructed to contact office with any additional concerns. ER/RTC precautions were reviewed with the patient. Follow-up plan: Return in about 6 months (around 04/16/2017) for DIABETES RECHECK AND ESTABLISH PRIMARY CARE.

## 2016-10-16 NOTE — Telephone Encounter (Signed)
Thank you, we will advise today at office visit

## 2016-10-16 NOTE — Patient Instructions (Addendum)
Aches/Pains, Fever Acetaminophen (Tylenol) 500 mg tablets - take max 2 tablets (1000 mg) every 6 hours (4 times per day)  Ibuprofen (Motrin) 200 mg tablets - take max 4 tablets (800 mg) every 6 hours  Sinus Congestion Cromolyn Nasal Spray (NasalCrom) 1 spray each nostril 3-4 times per day, max 6 imes per day Nasal Saline if desired Oxymetolazone (Afrin, others) sparing use due to rebound congestion Phenylephrine PE (Sudafed) 10 mg tablets every 4 hours (or the 12-hour formulation) Diphenhydramine (Benadryl) 25 mg tablets - take max 2 tablets every 4 hours  Cough Dextromethorphan (Robitussin, others) - cough suppressant Guaifenesin (Robitussin, Mucinex, others) - expectorant (helps cough up mucus) The above medications also come in a combination tablet Lozenges w/ Benzocaine + Menthol (Cepacol) Honey - as much as you want Teas which "coat the throat" - look for ingredients Elm Bark, Licorice Root, Marshmallow Root  Other Zinc Lozenges within 24 hours of symptoms onset - mixed evidence this shortens the duration of the common cold Don't waste your money on Vitamin C or Echinacea      IF you received an x-ray today, you will receive an invoice from [facility]. Please contact [facility] at [phone] with questions or concerns regarding your invoice.   IF you received labwork today, you will receive an invoice from [employer]. Please contact [facility] at [phone] with questions or concerns regarding your invoice.   Our billing staff will not be able to assist you with questions regarding bills from these companies.  You will be contacted with the lab results as soon as they are available. The fastest way to get your results is to activate your My Chart account. Instructions are located on the last page of this paperwork. If you have not heard from [place] regarding the results in 2 weeks, please contact this office.

## 2016-10-17 LAB — HEMOGLOBIN A1C
Hgb A1c MFr Bld: 6.4 % — ABNORMAL HIGH (ref <5.7)
Mean Plasma Glucose: 137 mg/dL

## 2016-12-08 DIAGNOSIS — Z7401 Bed confinement status: Secondary | ICD-10-CM | POA: Diagnosis not present

## 2016-12-08 DIAGNOSIS — H269 Unspecified cataract: Secondary | ICD-10-CM | POA: Diagnosis not present

## 2016-12-08 DIAGNOSIS — Z9981 Dependence on supplemental oxygen: Secondary | ICD-10-CM | POA: Diagnosis not present

## 2016-12-08 DIAGNOSIS — M25512 Pain in left shoulder: Secondary | ICD-10-CM | POA: Diagnosis not present

## 2016-12-08 DIAGNOSIS — Z681 Body mass index (BMI) 19 or less, adult: Secondary | ICD-10-CM | POA: Diagnosis not present

## 2016-12-08 DIAGNOSIS — S72012A Unspecified intracapsular fracture of left femur, initial encounter for closed fracture: Secondary | ICD-10-CM | POA: Diagnosis not present

## 2016-12-08 DIAGNOSIS — R9439 Abnormal result of other cardiovascular function study: Secondary | ICD-10-CM | POA: Diagnosis not present

## 2016-12-08 DIAGNOSIS — M79642 Pain in left hand: Secondary | ICD-10-CM | POA: Diagnosis not present

## 2016-12-08 DIAGNOSIS — D473 Essential (hemorrhagic) thrombocythemia: Secondary | ICD-10-CM | POA: Diagnosis not present

## 2016-12-08 DIAGNOSIS — C9510 Chronic leukemia of unspecified cell type not having achieved remission: Secondary | ICD-10-CM | POA: Diagnosis not present

## 2016-12-08 DIAGNOSIS — R1311 Dysphagia, oral phase: Secondary | ICD-10-CM | POA: Diagnosis not present

## 2016-12-08 DIAGNOSIS — D709 Neutropenia, unspecified: Secondary | ICD-10-CM | POA: Diagnosis not present

## 2016-12-08 DIAGNOSIS — M79605 Pain in left leg: Secondary | ICD-10-CM | POA: Diagnosis not present

## 2016-12-08 DIAGNOSIS — R897 Abnormal histological findings in specimens from other organs, systems and tissues: Secondary | ICD-10-CM | POA: Diagnosis not present

## 2016-12-08 DIAGNOSIS — C9210 Chronic myeloid leukemia, BCR/ABL-positive, not having achieved remission: Secondary | ICD-10-CM | POA: Diagnosis not present

## 2016-12-08 DIAGNOSIS — M25552 Pain in left hip: Secondary | ICD-10-CM | POA: Diagnosis not present

## 2016-12-08 DIAGNOSIS — R6889 Other general symptoms and signs: Secondary | ICD-10-CM | POA: Diagnosis not present

## 2016-12-08 DIAGNOSIS — R262 Difficulty in walking, not elsewhere classified: Secondary | ICD-10-CM | POA: Diagnosis not present

## 2016-12-08 DIAGNOSIS — M7989 Other specified soft tissue disorders: Secondary | ICD-10-CM | POA: Diagnosis not present

## 2016-12-08 DIAGNOSIS — M79651 Pain in right thigh: Secondary | ICD-10-CM | POA: Diagnosis not present

## 2016-12-08 DIAGNOSIS — D471 Chronic myeloproliferative disease: Secondary | ICD-10-CM | POA: Diagnosis not present

## 2016-12-08 DIAGNOSIS — E119 Type 2 diabetes mellitus without complications: Secondary | ICD-10-CM | POA: Diagnosis not present

## 2016-12-08 DIAGNOSIS — S299XXA Unspecified injury of thorax, initial encounter: Secondary | ICD-10-CM | POA: Diagnosis not present

## 2016-12-08 DIAGNOSIS — S0990XA Unspecified injury of head, initial encounter: Secondary | ICD-10-CM | POA: Diagnosis not present

## 2016-12-08 DIAGNOSIS — D72829 Elevated white blood cell count, unspecified: Secondary | ICD-10-CM | POA: Diagnosis not present

## 2016-12-08 DIAGNOSIS — I491 Atrial premature depolarization: Secondary | ICD-10-CM | POA: Diagnosis not present

## 2016-12-08 DIAGNOSIS — S7292XA Unspecified fracture of left femur, initial encounter for closed fracture: Secondary | ICD-10-CM | POA: Diagnosis not present

## 2016-12-08 DIAGNOSIS — S72001A Fracture of unspecified part of neck of right femur, initial encounter for closed fracture: Secondary | ICD-10-CM | POA: Diagnosis not present

## 2016-12-08 DIAGNOSIS — E860 Dehydration: Secondary | ICD-10-CM | POA: Diagnosis not present

## 2016-12-08 DIAGNOSIS — M25532 Pain in left wrist: Secondary | ICD-10-CM | POA: Diagnosis not present

## 2016-12-08 DIAGNOSIS — Z9181 History of falling: Secondary | ICD-10-CM | POA: Diagnosis not present

## 2016-12-08 DIAGNOSIS — E871 Hypo-osmolality and hyponatremia: Secondary | ICD-10-CM | POA: Diagnosis not present

## 2016-12-08 DIAGNOSIS — S72052A Unspecified fracture of head of left femur, initial encounter for closed fracture: Secondary | ICD-10-CM | POA: Diagnosis not present

## 2016-12-08 DIAGNOSIS — D696 Thrombocytopenia, unspecified: Secondary | ICD-10-CM | POA: Diagnosis not present

## 2016-12-08 DIAGNOSIS — S3992XA Unspecified injury of lower back, initial encounter: Secondary | ICD-10-CM | POA: Diagnosis not present

## 2016-12-08 DIAGNOSIS — F99 Mental disorder, not otherwise specified: Secondary | ICD-10-CM | POA: Diagnosis not present

## 2016-12-08 DIAGNOSIS — Z96642 Presence of left artificial hip joint: Secondary | ICD-10-CM | POA: Diagnosis not present

## 2016-12-08 DIAGNOSIS — Z136 Encounter for screening for cardiovascular disorders: Secondary | ICD-10-CM | POA: Diagnosis not present

## 2016-12-08 DIAGNOSIS — E1140 Type 2 diabetes mellitus with diabetic neuropathy, unspecified: Secondary | ICD-10-CM | POA: Diagnosis not present

## 2016-12-08 DIAGNOSIS — R161 Splenomegaly, not elsewhere classified: Secondary | ICD-10-CM | POA: Diagnosis not present

## 2016-12-08 DIAGNOSIS — R0902 Hypoxemia: Secondary | ICD-10-CM | POA: Diagnosis not present

## 2016-12-08 DIAGNOSIS — Z471 Aftercare following joint replacement surgery: Secondary | ICD-10-CM | POA: Diagnosis not present

## 2016-12-08 DIAGNOSIS — R1110 Vomiting, unspecified: Secondary | ICD-10-CM | POA: Diagnosis not present

## 2016-12-08 DIAGNOSIS — W19XXXA Unspecified fall, initial encounter: Secondary | ICD-10-CM | POA: Diagnosis not present

## 2016-12-08 DIAGNOSIS — S72012D Unspecified intracapsular fracture of left femur, subsequent encounter for closed fracture with routine healing: Secondary | ICD-10-CM | POA: Diagnosis not present

## 2016-12-08 DIAGNOSIS — C9500 Acute leukemia of unspecified cell type not having achieved remission: Secondary | ICD-10-CM | POA: Diagnosis not present

## 2016-12-08 DIAGNOSIS — I4581 Long QT syndrome: Secondary | ICD-10-CM | POA: Diagnosis not present

## 2016-12-08 DIAGNOSIS — M6281 Muscle weakness (generalized): Secondary | ICD-10-CM | POA: Diagnosis not present

## 2016-12-08 DIAGNOSIS — S72002A Fracture of unspecified part of neck of left femur, initial encounter for closed fracture: Secondary | ICD-10-CM | POA: Diagnosis not present

## 2016-12-08 DIAGNOSIS — K5900 Constipation, unspecified: Secondary | ICD-10-CM | POA: Diagnosis not present

## 2016-12-08 DIAGNOSIS — T1490XA Injury, unspecified, initial encounter: Secondary | ICD-10-CM | POA: Diagnosis not present

## 2016-12-21 DIAGNOSIS — K5900 Constipation, unspecified: Secondary | ICD-10-CM | POA: Diagnosis not present

## 2016-12-21 DIAGNOSIS — Z9181 History of falling: Secondary | ICD-10-CM | POA: Diagnosis not present

## 2016-12-22 DIAGNOSIS — S72009A Fracture of unspecified part of neck of unspecified femur, initial encounter for closed fracture: Secondary | ICD-10-CM | POA: Diagnosis not present

## 2016-12-22 DIAGNOSIS — R262 Difficulty in walking, not elsewhere classified: Secondary | ICD-10-CM | POA: Diagnosis not present

## 2016-12-22 DIAGNOSIS — S46002A Unspecified injury of muscle(s) and tendon(s) of the rotator cuff of left shoulder, initial encounter: Secondary | ICD-10-CM | POA: Diagnosis not present

## 2016-12-22 DIAGNOSIS — R1311 Dysphagia, oral phase: Secondary | ICD-10-CM | POA: Diagnosis not present

## 2016-12-22 DIAGNOSIS — M6281 Muscle weakness (generalized): Secondary | ICD-10-CM | POA: Diagnosis not present

## 2016-12-22 DIAGNOSIS — S7292XA Unspecified fracture of left femur, initial encounter for closed fracture: Secondary | ICD-10-CM | POA: Diagnosis not present

## 2016-12-22 DIAGNOSIS — Z9981 Dependence on supplemental oxygen: Secondary | ICD-10-CM | POA: Diagnosis not present

## 2016-12-22 DIAGNOSIS — Z7401 Bed confinement status: Secondary | ICD-10-CM | POA: Diagnosis not present

## 2016-12-22 DIAGNOSIS — E1140 Type 2 diabetes mellitus with diabetic neuropathy, unspecified: Secondary | ICD-10-CM | POA: Diagnosis not present

## 2016-12-22 DIAGNOSIS — D72829 Elevated white blood cell count, unspecified: Secondary | ICD-10-CM | POA: Diagnosis not present

## 2016-12-22 DIAGNOSIS — C9590 Leukemia, unspecified not having achieved remission: Secondary | ICD-10-CM | POA: Diagnosis not present

## 2016-12-22 DIAGNOSIS — E871 Hypo-osmolality and hyponatremia: Secondary | ICD-10-CM | POA: Diagnosis not present

## 2016-12-22 DIAGNOSIS — M79605 Pain in left leg: Secondary | ICD-10-CM | POA: Diagnosis not present

## 2016-12-22 DIAGNOSIS — S72012D Unspecified intracapsular fracture of left femur, subsequent encounter for closed fracture with routine healing: Secondary | ICD-10-CM | POA: Diagnosis not present

## 2016-12-22 DIAGNOSIS — S7292XD Unspecified fracture of left femur, subsequent encounter for closed fracture with routine healing: Secondary | ICD-10-CM | POA: Diagnosis not present

## 2016-12-22 DIAGNOSIS — Z9181 History of falling: Secondary | ICD-10-CM | POA: Diagnosis not present

## 2016-12-22 DIAGNOSIS — M25552 Pain in left hip: Secondary | ICD-10-CM | POA: Diagnosis not present

## 2016-12-22 DIAGNOSIS — D471 Chronic myeloproliferative disease: Secondary | ICD-10-CM | POA: Diagnosis not present

## 2016-12-22 DIAGNOSIS — D473 Essential (hemorrhagic) thrombocythemia: Secondary | ICD-10-CM | POA: Diagnosis not present

## 2016-12-22 DIAGNOSIS — Z96642 Presence of left artificial hip joint: Secondary | ICD-10-CM | POA: Diagnosis not present

## 2016-12-22 DIAGNOSIS — E119 Type 2 diabetes mellitus without complications: Secondary | ICD-10-CM | POA: Diagnosis not present

## 2016-12-22 DIAGNOSIS — K5900 Constipation, unspecified: Secondary | ICD-10-CM | POA: Diagnosis not present

## 2016-12-22 DIAGNOSIS — R0902 Hypoxemia: Secondary | ICD-10-CM | POA: Diagnosis not present

## 2016-12-28 DIAGNOSIS — E871 Hypo-osmolality and hyponatremia: Secondary | ICD-10-CM | POA: Diagnosis not present

## 2016-12-28 DIAGNOSIS — E119 Type 2 diabetes mellitus without complications: Secondary | ICD-10-CM | POA: Diagnosis not present

## 2016-12-28 DIAGNOSIS — E1140 Type 2 diabetes mellitus with diabetic neuropathy, unspecified: Secondary | ICD-10-CM | POA: Diagnosis not present

## 2016-12-30 DIAGNOSIS — Z9181 History of falling: Secondary | ICD-10-CM | POA: Diagnosis not present

## 2016-12-30 DIAGNOSIS — K5900 Constipation, unspecified: Secondary | ICD-10-CM | POA: Diagnosis not present

## 2017-01-05 DIAGNOSIS — S7292XD Unspecified fracture of left femur, subsequent encounter for closed fracture with routine healing: Secondary | ICD-10-CM | POA: Diagnosis not present

## 2017-01-05 DIAGNOSIS — D473 Essential (hemorrhagic) thrombocythemia: Secondary | ICD-10-CM | POA: Diagnosis not present

## 2017-01-05 DIAGNOSIS — E119 Type 2 diabetes mellitus without complications: Secondary | ICD-10-CM | POA: Diagnosis not present

## 2017-01-05 DIAGNOSIS — D471 Chronic myeloproliferative disease: Secondary | ICD-10-CM | POA: Diagnosis not present

## 2017-01-11 DIAGNOSIS — E119 Type 2 diabetes mellitus without complications: Secondary | ICD-10-CM | POA: Diagnosis not present

## 2017-01-11 DIAGNOSIS — C9590 Leukemia, unspecified not having achieved remission: Secondary | ICD-10-CM | POA: Diagnosis not present

## 2017-01-11 DIAGNOSIS — S72009A Fracture of unspecified part of neck of unspecified femur, initial encounter for closed fracture: Secondary | ICD-10-CM | POA: Diagnosis not present

## 2017-01-11 DIAGNOSIS — D471 Chronic myeloproliferative disease: Secondary | ICD-10-CM | POA: Diagnosis not present

## 2017-01-11 DIAGNOSIS — M6281 Muscle weakness (generalized): Secondary | ICD-10-CM | POA: Diagnosis not present

## 2017-01-17 DIAGNOSIS — C9590 Leukemia, unspecified not having achieved remission: Secondary | ICD-10-CM | POA: Diagnosis not present

## 2017-01-17 DIAGNOSIS — E119 Type 2 diabetes mellitus without complications: Secondary | ICD-10-CM | POA: Diagnosis not present

## 2017-01-17 DIAGNOSIS — M6281 Muscle weakness (generalized): Secondary | ICD-10-CM | POA: Diagnosis not present

## 2017-01-17 DIAGNOSIS — D471 Chronic myeloproliferative disease: Secondary | ICD-10-CM | POA: Diagnosis not present

## 2017-01-19 DIAGNOSIS — E119 Type 2 diabetes mellitus without complications: Secondary | ICD-10-CM | POA: Diagnosis not present

## 2017-01-19 DIAGNOSIS — D45 Polycythemia vera: Secondary | ICD-10-CM | POA: Diagnosis not present

## 2017-01-19 DIAGNOSIS — S72009A Fracture of unspecified part of neck of unspecified femur, initial encounter for closed fracture: Secondary | ICD-10-CM | POA: Diagnosis not present

## 2017-01-19 DIAGNOSIS — D471 Chronic myeloproliferative disease: Secondary | ICD-10-CM | POA: Diagnosis not present

## 2017-01-24 DIAGNOSIS — Z9181 History of falling: Secondary | ICD-10-CM | POA: Diagnosis not present

## 2017-01-24 DIAGNOSIS — Z0000 Encounter for general adult medical examination without abnormal findings: Secondary | ICD-10-CM | POA: Diagnosis not present

## 2017-01-24 DIAGNOSIS — K5900 Constipation, unspecified: Secondary | ICD-10-CM | POA: Diagnosis not present

## 2017-01-24 DIAGNOSIS — M25522 Pain in left elbow: Secondary | ICD-10-CM | POA: Diagnosis not present

## 2017-01-24 DIAGNOSIS — E119 Type 2 diabetes mellitus without complications: Secondary | ICD-10-CM | POA: Diagnosis not present

## 2017-01-28 ENCOUNTER — Telehealth: Payer: Self-pay | Admitting: Oncology

## 2017-01-28 NOTE — Telephone Encounter (Signed)
Patient called and cancelled appointment because he moved to [facility]

## 2017-02-01 ENCOUNTER — Ambulatory Visit: Payer: Medicare HMO | Admitting: Oncology

## 2017-02-01 ENCOUNTER — Other Ambulatory Visit: Payer: Medicare HMO

## 2017-02-03 DIAGNOSIS — Z0389 Encounter for observation for other suspected diseases and conditions ruled out: Secondary | ICD-10-CM | POA: Diagnosis not present

## 2017-02-03 DIAGNOSIS — D473 Essential (hemorrhagic) thrombocythemia: Secondary | ICD-10-CM | POA: Diagnosis not present

## 2017-02-03 DIAGNOSIS — Z96649 Presence of unspecified artificial hip joint: Secondary | ICD-10-CM | POA: Diagnosis not present

## 2017-02-08 DIAGNOSIS — M6281 Muscle weakness (generalized): Secondary | ICD-10-CM | POA: Diagnosis not present

## 2017-02-08 DIAGNOSIS — D471 Chronic myeloproliferative disease: Secondary | ICD-10-CM | POA: Diagnosis not present

## 2017-02-08 DIAGNOSIS — R1311 Dysphagia, oral phase: Secondary | ICD-10-CM | POA: Diagnosis not present

## 2017-02-08 DIAGNOSIS — E119 Type 2 diabetes mellitus without complications: Secondary | ICD-10-CM | POA: Diagnosis not present

## 2017-02-14 DIAGNOSIS — I517 Cardiomegaly: Secondary | ICD-10-CM | POA: Diagnosis not present

## 2017-02-14 DIAGNOSIS — I509 Heart failure, unspecified: Secondary | ICD-10-CM | POA: Diagnosis not present

## 2017-03-15 DEATH — deceased

## 2021-11-04 ENCOUNTER — Encounter: Payer: Self-pay | Admitting: Gastroenterology
